# Patient Record
Sex: Female | Born: 1950 | Race: White | Hispanic: No | Marital: Married | Smoking: Former smoker
Health system: Southern US, Community
[De-identification: ages and names within clinical notes are randomized; demographics above are authoritative.]

## PROBLEM LIST (undated history)

## (undated) DIAGNOSIS — M549 Dorsalgia, unspecified: Secondary | ICD-10-CM

## (undated) DIAGNOSIS — J189 Pneumonia, unspecified organism: Secondary | ICD-10-CM

## (undated) DIAGNOSIS — G20 Parkinson's disease: Secondary | ICD-10-CM

## (undated) DIAGNOSIS — F32A Depression, unspecified: Secondary | ICD-10-CM

## (undated) DIAGNOSIS — J449 Chronic obstructive pulmonary disease, unspecified: Secondary | ICD-10-CM

## (undated) DIAGNOSIS — F419 Anxiety disorder, unspecified: Secondary | ICD-10-CM

## (undated) DIAGNOSIS — I10 Essential (primary) hypertension: Secondary | ICD-10-CM

## (undated) DIAGNOSIS — J42 Unspecified chronic bronchitis: Secondary | ICD-10-CM

## (undated) DIAGNOSIS — G709 Myoneural disorder, unspecified: Secondary | ICD-10-CM

## (undated) DIAGNOSIS — R911 Solitary pulmonary nodule: Secondary | ICD-10-CM

## (undated) DIAGNOSIS — E039 Hypothyroidism, unspecified: Secondary | ICD-10-CM

## (undated) DIAGNOSIS — G20A1 Parkinson's disease without dyskinesia, without mention of fluctuations: Secondary | ICD-10-CM

## (undated) DIAGNOSIS — E079 Disorder of thyroid, unspecified: Secondary | ICD-10-CM

## (undated) DIAGNOSIS — F329 Major depressive disorder, single episode, unspecified: Secondary | ICD-10-CM

## (undated) HISTORY — DX: Pneumonia, unspecified organism: J18.9

## (undated) HISTORY — DX: Anxiety disorder, unspecified: F41.9

## (undated) HISTORY — PX: CATARACT EXTRACTION, BILATERAL: SHX1313

## (undated) HISTORY — DX: Myoneural disorder, unspecified: G70.9

## (undated) HISTORY — DX: Solitary pulmonary nodule: R91.1

## (undated) HISTORY — DX: Parkinson's disease: G20

## (undated) HISTORY — DX: Parkinson's disease without dyskinesia, without mention of fluctuations: G20.A1

## (undated) HISTORY — DX: Unspecified chronic bronchitis: J42

## (undated) HISTORY — DX: Hypothyroidism, unspecified: E03.9

## (undated) HISTORY — DX: Chronic obstructive pulmonary disease, unspecified: J44.9

## (undated) HISTORY — DX: Disorder of thyroid, unspecified: E07.9

## (undated) HISTORY — DX: Dorsalgia, unspecified: M54.9

## (undated) HISTORY — DX: Major depressive disorder, single episode, unspecified: F32.9

## (undated) HISTORY — DX: Depression, unspecified: F32.A

## (undated) HISTORY — DX: Essential (primary) hypertension: I10

## (undated) HISTORY — PX: OTHER SURGICAL HISTORY: SHX169

---

## 1997-12-26 ENCOUNTER — Other Ambulatory Visit: Admission: RE | Admit: 1997-12-26 | Discharge: 1997-12-26 | Payer: Self-pay | Admitting: Obstetrics and Gynecology

## 1999-02-22 ENCOUNTER — Other Ambulatory Visit: Admission: RE | Admit: 1999-02-22 | Discharge: 1999-02-22 | Payer: Self-pay | Admitting: Obstetrics and Gynecology

## 1999-06-13 ENCOUNTER — Other Ambulatory Visit: Admission: RE | Admit: 1999-06-13 | Discharge: 1999-06-13 | Payer: Self-pay | Admitting: Obstetrics and Gynecology

## 2000-02-24 ENCOUNTER — Other Ambulatory Visit: Admission: RE | Admit: 2000-02-24 | Discharge: 2000-02-24 | Payer: Self-pay | Admitting: Obstetrics and Gynecology

## 2001-03-16 ENCOUNTER — Other Ambulatory Visit: Admission: RE | Admit: 2001-03-16 | Discharge: 2001-03-16 | Payer: Self-pay | Admitting: Obstetrics and Gynecology

## 2001-12-03 ENCOUNTER — Ambulatory Visit (HOSPITAL_COMMUNITY): Admission: RE | Admit: 2001-12-03 | Discharge: 2001-12-03 | Payer: Self-pay | Admitting: Gastroenterology

## 2002-03-22 ENCOUNTER — Other Ambulatory Visit: Admission: RE | Admit: 2002-03-22 | Discharge: 2002-03-22 | Payer: Self-pay | Admitting: Obstetrics and Gynecology

## 2003-03-28 ENCOUNTER — Other Ambulatory Visit: Admission: RE | Admit: 2003-03-28 | Discharge: 2003-03-28 | Payer: Self-pay | Admitting: Obstetrics and Gynecology

## 2004-04-02 ENCOUNTER — Other Ambulatory Visit: Admission: RE | Admit: 2004-04-02 | Discharge: 2004-04-02 | Payer: Self-pay | Admitting: Obstetrics and Gynecology

## 2004-10-09 ENCOUNTER — Ambulatory Visit (HOSPITAL_COMMUNITY): Admission: RE | Admit: 2004-10-09 | Discharge: 2004-10-10 | Payer: Self-pay | Admitting: Orthopaedic Surgery

## 2005-04-02 ENCOUNTER — Other Ambulatory Visit: Admission: RE | Admit: 2005-04-02 | Discharge: 2005-04-02 | Payer: Self-pay | Admitting: Obstetrics and Gynecology

## 2006-04-08 ENCOUNTER — Other Ambulatory Visit: Admission: RE | Admit: 2006-04-08 | Discharge: 2006-04-08 | Payer: Self-pay | Admitting: *Deleted

## 2007-04-14 ENCOUNTER — Other Ambulatory Visit: Admission: RE | Admit: 2007-04-14 | Discharge: 2007-04-14 | Payer: Self-pay | Admitting: Family Medicine

## 2009-09-04 ENCOUNTER — Inpatient Hospital Stay: Payer: Self-pay | Admitting: Internal Medicine

## 2009-11-01 ENCOUNTER — Encounter: Admission: RE | Admit: 2009-11-01 | Discharge: 2009-11-01 | Payer: Self-pay | Admitting: Family Medicine

## 2009-11-07 ENCOUNTER — Other Ambulatory Visit: Admission: RE | Admit: 2009-11-07 | Discharge: 2009-11-07 | Payer: Self-pay | Admitting: Family Medicine

## 2009-11-19 DIAGNOSIS — F341 Dysthymic disorder: Secondary | ICD-10-CM

## 2009-11-20 ENCOUNTER — Ambulatory Visit: Payer: Self-pay | Admitting: Internal Medicine

## 2009-11-20 DIAGNOSIS — J45909 Unspecified asthma, uncomplicated: Secondary | ICD-10-CM | POA: Insufficient documentation

## 2009-11-20 DIAGNOSIS — J479 Bronchiectasis, uncomplicated: Secondary | ICD-10-CM

## 2010-01-09 ENCOUNTER — Ambulatory Visit: Payer: Self-pay | Admitting: Internal Medicine

## 2010-04-16 ENCOUNTER — Telehealth: Payer: Self-pay | Admitting: Internal Medicine

## 2010-04-23 ENCOUNTER — Encounter: Payer: Self-pay | Admitting: Internal Medicine

## 2010-04-24 ENCOUNTER — Ambulatory Visit: Payer: Self-pay | Admitting: Internal Medicine

## 2010-04-24 DIAGNOSIS — J984 Other disorders of lung: Secondary | ICD-10-CM | POA: Insufficient documentation

## 2010-06-11 NOTE — Miscellaneous (Signed)
Summary: Orders Update pft charges  Clinical Lists Changes  Orders: Added new Service order of Carbon Monoxide diffusing w/capacity (94720) - Signed Added new Service order of Lung Volumes (94240) - Signed Added new Service order of Spirometry (Pre & Post) (94060) - Signed 

## 2010-06-11 NOTE — Progress Notes (Signed)
Summary: needs rov with cxr  ---- Converted from flag ---- ---- 01/09/2010 8:23 PM, Nyoka Cowden MD wrote: f/u cxr due ------------------------------  [id number]  Vernie Murders  April 16, 2010 AM   pt aware appt and cxr scheduled fo r12-14-11. Carron Curie CMA  April 16, 2010 4:08 PM

## 2010-06-11 NOTE — Assessment & Plan Note (Signed)
Summary: Pulmonary/ ext ov with hfa 75% p coaching   Copy to:  Dr. [name] Primary Provider/Referring Provider:  Dr. [name]  CC:  followup on pfts and c/o a crackly feeling in throat and throat with heat have to use the ventolin at least 2x a week.  History of Present Illness: 60  yowf quit smoking around 1980's with tendency to bad coughing going back to when she was in her 30's and after stopped smoking seemed some better then worse again in mid 80's took shots for  " dust and mold allergy"  for several years didn't really help so stopped sometime  before 1988.  November 20, 2009  1st pulmonary office eval  cc recurrent cough since her 30s  that required abx and intermittent prednisone but did not require maint rx or any kind of inhalers and felt well between episodes up to 2 x per year.  Then April 2011 after bagging leaves bad cough green mucus,  sweaty, fever > er to [facility] admit x 1 day with dx pna  and rx with abx and on day of ov feels fine.  No cough, no sob, not needing ventolin now ? if needs advair, reduced it on her own to once per day and no noct symptoms. rec: try off advair, rx gerd diet and as needed albuterol, work on optimal hfa technique  January 09, 2010 followup on pfts, c/o a crackly feeling in throat and throat with heat have to use the ventolin at least 2x a week.  no noct or early am symptoms of cough or sob.  Pt denies any significant sore throat, dysphagia, itching, sneezing,  nasal congestion or excess secretions,  fever, chills, sweats, unintended wt loss, pleuritic or exertional cp, hempoptysis, change in activity tolerance  orthopnea pnd or leg swelling Pt also denies any obvious fluctuation in symptoms with weather or environmental change or other alleviating or aggravating factors x as above.    Preventive Screening-Counseling & Management  Alcohol-Tobacco     Smoking Status: quit > 6 months     Year Quit: 1970  Current Medications  (verified): 1)  Ventolin Hfa 108 (90 Base) Mcg/act Aers (Albuterol Sulfate) .... 2 Puffs Every 4-6 Hrs As Needed 2)  Vitamin E-400 400 Unit Caps (Vitamin E) .Marland Kitchen.. 1 Once Daily 3)  Calcium Lactate 750 Mg Tabs (Calcium Lactate) .... 2 Once Daily 4)  Vitamin B-12 2500 Mcg Subl (Cyanocobalamin) .Marland Kitchen.. 1 Once Daily 5)  Sertraline Hcl 100 Mg Tabs (Sertraline Hcl) .Marland Kitchen.. 1 Once Daily 6)  Glucosamine Hci Msn 1500-150 Mg .Marland Kitchen.. 1 By Mouth Once Daily 7)  Melatonin 3 Mg .Marland Kitchen.. 1 By Mouth Once Daily 8)  Otc Vitamin D 2000 Iu .Marland Kitchen.. 1 By Mouth Once Daily  Allergies: 1)  ! Pcn 2)  ! Cipro  Past History:  Past Medical History: Asthma    - HFA 50% at best November 20, 2009 >  75% January 09, 2010  Bronchiectasis with nodules    - See CT chest 11/01/09    - PFT's January 09, 2010 FEV1 1.85 (78%) ratio 62  no better p B2  DLCO 89%    - Pneumovax January 09, 2010 (second shot age 89)   Social History: Smoking Status:  quit > 6 months  Vital Signs:  Patient profile:   60 year old female Height:      64.5 inches Weight:      172 pounds BMI:     29.17 O2 Sat:  95 % on Room air Temp:     98.2 degrees F oral Pulse rate:   88 / minute BP sitting:   140 / 80  (right arm) Cuff size:   regular  Vitals Entered By: Vernie Murders (January 09, 2010 4:24 PM)  O2 Flow:  Room air CC: followup on pfts, c/o a crackly feeling in throat and throat with heat have to use the ventolin at least 2x a week Is Patient Diabetic? No Comments pt want flu vaccine today pharmacy verified .Vernie Murders  January 09, 2010 4:26 PM    Physical Exam  Additional Exam:  amb slt  wf nad no longer hoarse wt 170 November 21, 2009 > 172 January 09, 2010  HEENT: nl dentition, turbinates, and orophanx. Nl external ear canals without cough reflex NECK :  without JVD/Nodes/TM/ nl carotid upstrokes bilaterally LUNGS: no acc muscle use, clear to A and P bilaterally without cough on insp or exp maneuvers CV:  RRR  no s3 or murmur or increase in P2,  no edema  ABD:  soft and nontender with nl excursion in the supine position. 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 Contingencies discussed today including the rule of two's.     I spent extra time with the patient today explaining optimal mdi  technique.  This improved from  50-75%, ok to start dulera 100 if symptoms worsen or needing saba > 2 x weekly  Medications Added to Medication List This Visit: 1)  Glucosamine Hci Msn 1500-150 Mg  .Marland KitchenMarland Kitchen. 1 by mouth once daily 2)  Melatonin 3 Mg  .Marland KitchenMarland Kitchen. 1 by mouth once daily 3)  Otc Vitamin D 2000 Iu  .Marland Kitchen.. 1 by mouth once daily  Other Orders: Flu Vaccine 65yrs + (04540) Administration Flu vaccine - MCR (J8119) Pneumococcal Vaccine (14782) Admin 1st Vaccine (95621) Admin of Any Addtl Vaccine (30865) Est. Patient Level IV (78469)  Patient Instructions: 1)  If needing ventolin more than twice a week start dulera 100 2 puffs first thing  in am and 2 puffs again in pm about 12 hours later  2)  Return as needed  Flu Vaccine Consent Questions     Do you have a history of severe allergic reactions to this vaccine? no    Any prior history of allergic reactions to egg and/or gelatin? no    Do you have a sensitivity to the preservative Thimersol? no    Do you have a past history of Guillan-Barre Syndrome? no    Do you currently have an acute febrile illness? no    Have you ever had  a severe reaction to latex? no    Vaccine information given and explained to patient? yes    Are you currently pregnant? no    Lot Number:[id number]   Exp Date:11/09/2010   Manufacturer: [employer]    Site Given  Left Deltoid Kittie Plater  January 09, 2010 4:54 PM  Immunizations Administered:  Pneumonia Vaccine:    Vaccine Type: Pneumovax    Site: right deltoid    Mfr: Merck    Dose: 0.5 ml    Given by: Vernie Murders    Exp. Date: 05/29/2011    Lot #: [id number]    VIS given: 12/08/95 version given January 09, 2010.  .Neysa Bonito

## 2010-06-11 NOTE — Assessment & Plan Note (Signed)
Summary: Pulmonary/ new pt eval for bronchiectasis   Visit Type:  Initial Consult Copy to:  Dr. [name] Primary Provider/Referring Provider:  Dr. [name]  CC:  Abnormal CT Chest.  History of Present Illness: 60 yowf quit smoking around 1980's with tendency to bad coughing going back to when she was in her 30's and after stopped smoking seemed some better then worse again in mid 80's took shots for  " dust and mold allergy"  for several years didn't really help so stopped sometime  before 1988.  November 20, 2009  1st pulmonary office eval  cc recurrent cough since her 30s  that required abx and intermittent prednisone but did not require maint rx or any kind of inhalers and felt well between episodes up to 2 x per year.  Then April 2011 after bagging leaves bad cough green mucus,  sweaty, fever > er to [facility] admit x 1 day with dx pna  and rx with abx and on day of ov feels fine.  No cough, no sob, not needing ventolin now ? if needs advair, reduced it on her own to once per day and no noct symptoms.  Pt denies any significant sore throat, dysphagia, itching, sneezing,  nasal congestion or excess secretions,  fever, chills, sweats, unintended wt loss, pleuritic or exertional cp, hempoptysis, change in activity tolerance  orthopnea pnd or leg swelling Pt also denies any obvious fluctuation in symptoms with weather or environmental change or other alleviating or aggravating factors.       Current Medications (verified): 1)  Ventolin Hfa 108 (90 Base) Mcg/act Aers (Albuterol Sulfate) .... 2 Puffs Every 4-6 Hrs As Needed 2)  Vitamin E-400 400 Unit Caps (Vitamin E) .Marland Kitchen.. 1 Once Daily 3)  Calcium Lactate 750 Mg Tabs (Calcium Lactate) .... 2 Once Daily 4)  Fish Oil 1200 Mg Caps (Omega-3 Fatty Acids) .Marland Kitchen.. 1 Once Daily 5)  Vitamin B-12 2500 Mcg Subl (Cyanocobalamin) .Marland Kitchen.. 1 Once Daily 6)  Advair Diskus 100-50 Mcg/dose Aepb (Fluticasone-Salmeterol) .Marland Kitchen.. 1 Puff Two Times A Day 7)   Sertraline Hcl 100 Mg Tabs (Sertraline Hcl) .Marland Kitchen.. 1 Once Daily  Allergies: 1)  ! Pcn 2)  ! Cipro  Past History:  Past Medical History: Asthma    - HFA 50% at best November 20, 2009  Bronchiectasis with nodules    - See CT chest 11/01/09  Past Surgical History: Back surgery x 2- 1996 and 2005 Tubal ligation- 1992  Family History: Heart dz- Father Negative for respiratory diseases or atopy   Social History: Married with children Retired Runner, broadcasting/film/video Former smoker.  Quit in 1980.  Smoked approx 10 yrs up to 1/4 ppd. ETOH occ wine  Review of Systems  The patient denies shortness of breath with activity, shortness of breath at rest, productive cough, non-productive cough, coughing up blood, chest pain, irregular heartbeats, acid heartburn, indigestion, loss of appetite, weight change, abdominal pain, difficulty swallowing, sore throat, tooth/dental problems, headaches, nasal congestion/difficulty breathing through nose, sneezing, itching, ear ache, anxiety, depression, hand/feet swelling, joint stiffness or pain, rash, change in color of mucus, and fever.    Vital Signs:  Patient profile:   60 year old female Height:      64.5 inches Weight:      170.50 pounds BMI:     28.92 O2 Sat:      96 % on Room air Temp:     98.1 degrees F oral Pulse rate:   77 / minute BP sitting:  150 / 90  (left arm)  Vitals Entered By: Vernie Murders (November 20, 2009 8:45 AM)  O2 Flow:  Room air  Physical Exam  Additional Exam:  amb slt hoarse wf nad wt 170 November 21, 2009  HEENT: nl dentition, turbinates, and orophanx. Nl external ear canals without cough reflex NECK :  without JVD/Nodes/TM/ nl carotid upstrokes bilaterally LUNGS: no acc muscle use, clear to A and P bilaterally without cough on insp or exp maneuvers CV:  RRR  no s3 or murmur or increase in P2, no edema  ABD:  soft and nontender with nl excursion in the supine position. No bruits or organomegaly, bowel sounds nl MS:  warm without  deformities, calf tenderness, cyanosis or clubbing SKIN: warm and dry without lesions   NEURO:  alert, approp, no deficits     CT of Chest  Procedure date:  11/01/2009  Findings:      bronchiectasis clustered nodularity and mild lingular consolidation  Impression & Recommendations:  Problem # 1:  ASTHMA (ICD-493.90) All goals of asthma met including optimal function and elimination of symptoms with minimum need for rescue therapy. Contingencies discussed today including the rule of two's.   Not clear she needs advair, will ask her to stop it and return for pfts  I spent extra time with the patient today explaining optimal mdi  technique.  This improved from  25-50%  Problem # 2:  BRONCHIECTASIS W/O ACUTE EXACERBATION (ICD-494.0) Longstanding perhaps with low grade MAI by CT but certainly does not need aggressive w/u or rx at this point  Discussed natural hx and meaning of bronchial scarring. Discussed in detail all the  indications, usual  risks and alternatives  relative to the benefits with patient who agrees to proceed with conservative F/U here serially to put more points on the curve.  General GERD precaution given including avoidance of oils in diet  Other Orders: Consultation Level V (16109) Consultation Level V (60454)  Patient Instructions: 1)  Scarring of bronchial tubes leads to stagnant mucus 2)  Stop advair 3)  Ventolin as needed is fine as long as don't need to use it more than twice a week and if so start back on advair 4)  Please schedule a follow-up appointment in 6 weeks, sooner if needed with PFT's  5)  for cough mucinex dm as needed  6)  GERD (REFLUX)  is a common cause of respiratory symptoms. It commonly presents without heartburn and can be treated with medication, but also with lifestyle changes including avoidance of late meals, excessive alcohol, smoking cessation, and avoid fatty foods, chocolate, peppermint, colas, red wine, and acidic juices such as  orange juice. NO MINT OR MENTHOL PRODUCTS SO NO COUGH DROPS  7)  USE SUGARLESS CANDY INSTEAD (jolley ranchers)  8)  NO OIL BASED VITAMINS (includes fish oil)

## 2010-06-13 NOTE — Assessment & Plan Note (Signed)
Summary: Pulmonary/ ext f/u ov for bronchiectasis/asthma hfa 75%   Copy to:  Dr. [name] Primary Provider/Referring Provider:  Dr. [name]  CC:  Cough-resolved.  History of Present Illness: 64  yowf quit smoking around 1980's with tendency to bad coughing going back to when she was in her 30's and after stopped smoking seemed some better then worse again in mid 80's took shots for  " dust and mold allergy"  for several years didn't really help so stopped sometime  before 1988.  November 20, 2009  1st pulmonary office eval  cc recurrent cough since her 30s  that required abx and intermittent prednisone but did not require maint rx or any kind of inhalers and felt well between episodes up to 2 x per year.  Then April 2011 after bagging leaves bad cough green mucus,  sweaty, fever > er to [facility] admit x 1 day with dx pna  and rx with abx and on day of ov feels fine.  rec: try off advair, rx gerd diet and as needed albuterol, work on optimal hfa technique  January 09, 2010 followup on pfts, c/o a crackly feeling in throat and throat with heat have to use the ventolin at least 2x a week.  no noct or early am symptoms of cough or sob. If needing ventolin more than twice a week start dulera 100 2 puffs first thing  in am and 2 puffs again in pm about 12 hours later  April 24, 2010 ov cc cough/ wheeze/ sob resolved on dulera 100 so able to taper to one twice daily and no flare.  Pt denies any significant sore throat, dysphagia, itching, sneezing,  nasal congestion or excess secretions,  fever, chills, sweats, unintended wt loss, pleuritic or exertional cp, hempoptysis, change in activity tolerance  orthopnea pnd or leg swelling Pt also denies any obvious fluctuation in symptoms with weather or environmental change or other alleviating or aggravating factors.       Current Medications (verified): 1)  Ventolin Hfa 108 (90 Base) Mcg/act Aers (Albuterol Sulfate) .... 2 Puffs Every 4-6  Hrs As Needed 2)  Vitamin E-400 400 Unit Caps (Vitamin E) .Marland Kitchen.. 1 Once Daily 3)  Calcium Lactate 750 Mg Tabs (Calcium Lactate) .... 2 Once Daily 4)  Vitamin B-12 2500 Mcg Subl (Cyanocobalamin) .Marland Kitchen.. 1 Once Daily 5)  Sertraline Hcl 100 Mg Tabs (Sertraline Hcl) .Marland Kitchen.. 1 Once Daily 6)  Glucosamine Hci Msn 1500-150 Mg .Marland Kitchen.. 1 By Mouth Once Daily 7)  Vitamin D 2000 Unit Tabs (Cholecalciferol) .Marland Kitchen.. 1 Once Daily 8)  Excedrin Extra Strength 250-250-65 Mg Tabs (Aspirin-Acetaminophen-Caffeine) .... Per Bottle Directions As Needed  Allergies (verified): 1)  ! Pcn 2)  ! Cipro  Past History:  Past Medical History: Asthma    - HFA 50% at best November 20, 2009 >  75% January 09, 2010 > 75% April 24, 2010  Bronchiectasis with nodules    - See CT chest 11/01/09    - PFT's January 09, 2010 FEV1 1.85 (78%) ratio 62  no better p B2  DLCO 89%    - Pneumovax January 09, 2010 (second shot age 9)   Vital Signs:  Patient profile:   60 year old female Weight:      179.50 pounds O2 Sat:      97 % on Room air Temp:     97.9 degrees F oral Pulse rate:   87 / minute BP sitting:   118 / 90  (left arm)  Vitals Entered By: Vernie Murders (April 24, 2010 9:07 AM)  O2 Flow:  Room air  Physical Exam  Additional Exam:  amb healthy appearin g  wf nad   wt 170 November 21, 2009 > 172 January 09, 2010 > 179  April 24, 2010  HEENT: nl dentition, turbinates, and orophanx. Nl external ear canals without cough reflex NECK :  without JVD/Nodes/TM/ nl carotid upstrokes bilaterally LUNGS: no acc muscle use, clear to A and P bilaterally without cough on insp or exp maneuvers CV:  RRR  no s3 or murmur or increase in P2, no edema  ABD:  soft and nontender with nl excursion in the supine position. No bruits or organomegaly, bowel sounds nl MS:  warm without deformities, calf tenderness, cyanosis or clubbing      CXR  Procedure date:  04/24/2010  Findings:      Stable scarring and/or bronchiectatic change in the lingula.   No active process.  Impression & Recommendations:  Problem # 1:  BRONCHIECTASIS W/O ACUTE EXACERBATION (ICD-494.0)  Assoc with minimal yet persistent airflow obstruction with minimal asthmatic component  For now, rx pneumovax and yearly flu plus maintain on dulera   I spent extra time with the patient today explaining optimal mdi  technique.  This improved from 50-75%  p coaching     Problem # 2:  PULMONARY NODULE (ICD-518.89) Although there are clearly abnormalities on CT scan, they should probably be considered "microscopic" since not obvious on plain cxr .   Muliple pulmonary nodules < 8 mm are below the radar screen for PET,  minimally invasive bx or f/u on cxr for that matter, and old xrays won't do any good here.  Most likely they are benign and related to bronchiectasis, can't r/o MAI but even if it was MAI no indication for rx so rec cxr in one year, sooner if new resp symptoms  Medications Added to Medication List This Visit: 1)  Vitamin D 2000 Unit Tabs (Cholecalciferol) .Marland Kitchen.. 1 once daily 2)  Excedrin Extra Strength [phone] Mg Tabs (Aspirin-acetaminophen-caffeine) .... Per bottle directions as needed 3)  Dulera 100-5 Mcg/act Aero (Mometasone furo-formoterol fum) .... 2 puffs first thing  in am and 2 puffs again in pm about 12 hours later  Complete Medication List: 1)  Ventolin Hfa 108 (90 Base) Mcg/act Aers (Albuterol sulfate) .... 2 puffs every 4-6 hrs as needed 2)  Vitamin E-400 400 Unit Caps (Vitamin e) .Marland Kitchen.. 1 once daily 3)  Calcium Lactate 750 Mg Tabs (Calcium lactate) .... 2 once daily 4)  Vitamin B-12 2500 Mcg Subl (Cyanocobalamin) .Marland Kitchen.. 1 once daily 5)  Sertraline Hcl 100 Mg Tabs (Sertraline hcl) .Marland Kitchen.. 1 once daily 6)  Glucosamine Hci Msn 1500-150 Mg  .Marland KitchenMarland Kitchen. 1 by mouth once daily 7)  Vitamin D 2000 Unit Tabs (Cholecalciferol) .Marland Kitchen.. 1 once daily 8)  Excedrin Extra Strength 250-250-65 Mg Tabs (Aspirin-acetaminophen-caffeine) .... Per bottle directions as needed 9)   Dulera 100-5 Mcg/act Aero (Mometasone furo-formoterol fum) .... 2 puffs first thing  in am and 2 puffs again in pm about 12 hours later  Other Orders: Est. Patient Level IV (93235)  Patient Instructions: 1)   Work on perfecting  inhaler technique:  relax and blow all the way out then take a nice smooth deep breath back in, triggering the inhaler at same time you start breathing in and hold for a few seconds. 2)  Return in one year for refills on dulera sooner if cough/ short of breathing or  whistling sound recur  Prescriptions: DULERA 100-5 MCG/ACT AERO (MOMETASONE FURO-FORMOTEROL FUM) 2 puffs first thing  in am and 2 puffs again in pm about 12 hours later  #1 x 11   Entered and Authorized by:   Nyoka Cowden MD   Signed by:   Nyoka Cowden MD on 04/24/2010   Method used:   Print then Give to Patient   RxID:   [id number]

## 2010-06-13 NOTE — Miscellaneous (Signed)
Summary: Orders Update  Clinical Lists Changes  Orders: Added new Test order of T-2 View CXR (71020TC) - Signed 

## 2010-09-27 NOTE — Procedures (Signed)
[facility]  Patient:    Gail Mercado, Gail Mercado Visit Number: [id number] MRN: [id number]          Service Type: END Location: ENDO Attending Physician:  Charna Elizabeth Dictated by:   Anselmo Rod, M.D. Proc. Date: 12/03/01 Admit Date:  12/03/2001   CC:         Cynthia P. Matthews-Romine, M.D.   Procedure Report  DATE OF BIRTH:  November 03, 1950  REFERRING PHYSICIAN:  Edwena Felty. Matthews-Romine, M.D.  PROCEDURE PERFORMED:  Screening colonoscopy.  ENDOSCOPIST:  Anselmo Rod, M.D.  INSTRUMENT USED:  Olympus pediatric adjustable colonoscope.  INDICATIONS FOR PROCEDURE:  The patient is a 59 year old white female with a history of guaiac positive stools undergoing screening colonoscopy.  Rule out colonic polyps, masses, hemorrhoids, etc.  PREPROCEDURE PREPARATION:  Informed consent was procured from the patient. The patient was fasted for eight hours prior to the procedure and prepped with a bottle of magnesium citrate and a gallon of NuLytely the night prior to the procedure.  PREPROCEDURE PHYSICAL:  The patient had stable vital signs.  Neck supple. Chest clear to auscultation.  S1, S2 regular.  Abdomen soft with normal bowel sounds.  DESCRIPTION OF PROCEDURE:  The patient was placed in the left lateral decubitus position and sedated with 100 mg of Demerol and 10 mg of Versed intravenously.  Once the patient was adequately sedated and maintained on low-flow oxygen and continuous cardiac monitoring, the Olympus video colonoscope was advanced from the rectum to the cecum and terminal ileum without difficulty.  The entire colonic mucosa appeared healthy with a normal vascular pattern, no masses, polyps, erosions, ulcerations or diverticula were seen.  The patient tolerated the procedure well without complications.  The appendiceal orifice and ileocecal valve were clearly visualized and photographed.  The terminal ileum appeared healthy as well.   There was no evidence of hemorrhoids on retroflexion in the rectum but a small internal hemorrhoids was seen on withdrawal of the scope.  IMPRESSION:  Normal colonoscopy to terminal ileum except for small internal hemorrhoids.  RECOMMENDATIONS: 1. A high fiber diet has been recommended. 2. Repeat guaiac testing to be done on an outpatient basis and further recommendations made as needed.Dictated by:   Anselmo Rod, M.D.  Attending Physician:  Charna Elizabeth DD:  12/03/01 TD:  12/06/01 Job: [id number]

## 2010-09-27 NOTE — Op Note (Signed)
NAMEELAN, Gail Mercado                 ACCOUNT NO.:  [id number]   MEDICAL RECORD NO.:  [id number]          PATIENT TYPE:  OIB   LOCATION:  5002                         FACILITY:  [facility]   PHYSICIAN:  Sharolyn Douglas, M.D.        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   PROCEDURE:  Left L4-5 extraforaminal diskectomy via intertransverse  approach.   SURGEON:  Sharolyn Douglas M.D.   ASSISTANT:  Verlin Fester, P.A.   ANESTHESIA:  General endotracheal.   COMPLICATIONS:  None.   ESTIMATED BLOOD LOSS:  Minimal.   INDICATIONS:  The patient is a pleasant 60 year old female with a large  intra an extraforaminal disk herniation at L4-5 on the left. She has  profound quadriceps weakness. She has elected to undergo extraforaminal  diskectomy in hopes of improving her weakness. Risks and benefits were  reviewed including non benefits, chronic pain, recurrent disk herniation and  need for additional surgery including fusion.   PROCEDURE:  The patient was identified in the holding area, taken to the  operating room, underwent general endotracheal anesthesia without  difficulty, given prophylactic IV antibiotics. Carefully positioned prone on  the Wilson frame. All bony prominences padded. Face and eyes protected at  all times. Back prepped, draped usual sterile fashion. Fluoroscopy was  brought into the field and the L4-5 level was identified. A 3 cm incision  was made 2 cm off the midline on the left side over the L4-5 level. The deep  fascia was incised. Dilators from the Max S retractor system were used to  dock onto the transverse processes of L4 and L5. We then placed the 70-mm  blades onto the retractor and placed it over the final dilator. The  retractor was attached to the operating room table using the attachment arm.  The  retractor was then gently spread dilating the paraspinal muscles.  Microscope was draped and brought into the field. The intertransverse  membrane was exposed. The pars interarticularis was identified. The  intertransverse membrane was then carefully elevated. The L4 nerve root was  identified, swollen and displaced dorsally. The nerve root was mobilized  laterally and the disk was identified. The disk space was easily entered  using a Cytogeneticist. We then utilized a pituitary to enter the disk  space and remove several large fragments of degenerative disk material. The  nerve root became more mobile. We then explored the extraforaminal space and  found a large disk fragment which had migrated proximally under the nerve  root. The wound was irrigated. Bleeding  was controlled. Fentanyl was left over the nerve root. The deep fascia  closed with an 0 Vicryl suture. Subcutaneous layer closed with 2-0 Vicryl  followed by Dermabond to approximate the skin edges. The patient was turned  supine, extubated without difficulty and transferred to recovery room in  stable condition.      MC/MEDQ  D:  10/09/2004  T:  10/10/2004  Job:  [id number]

## 2011-04-29 ENCOUNTER — Encounter: Payer: Self-pay | Admitting: Pulmonary Disease

## 2011-04-30 ENCOUNTER — Ambulatory Visit (INDEPENDENT_AMBULATORY_CARE_PROVIDER_SITE_OTHER): Payer: BC Managed Care – PPO | Admitting: Internal Medicine

## 2011-04-30 ENCOUNTER — Encounter: Payer: Self-pay | Admitting: Internal Medicine

## 2011-04-30 VITALS — BP 150/88 | HR 86 | Temp 97.7°F | Ht 65.0 in | Wt 168.4 lb

## 2011-04-30 DIAGNOSIS — J479 Bronchiectasis, uncomplicated: Secondary | ICD-10-CM

## 2011-04-30 MED ORDER — MOMETASONE FURO-FORMOTEROL FUM 100-5 MCG/ACT IN AERO: 1.0000 | INHALATION_SPRAY | Freq: Two times a day (BID) | RESPIRATORY_TRACT | Status: DC

## 2011-04-30 NOTE — Progress Notes (Signed)
Subjective:     Patient ID: Gail Mercado, female   DOB: August 24, 1950, 60 y.o.   MRN: [id number]  HPI  53  yowf  With documented bronchiectasis with minimal airflow obst quit smoking around 1980's with tendency to bad coughing going back to when she was in her 30's and after stopped smoking seemed some better then worse again in mid 80's took shots for " dust and mold allergy" for several years didn't really help so stopped sometime before 1988.   November 20, 2009 1st pulmonary office eval cc recurrent cough since her 30s that required abx and intermittent prednisone but did not require maint rx or any kind of inhalers and felt well between episodes up to 2 x per year. Then April 2011 after bagging leaves bad cough green mucus, sweaty, fever > er to [facility] admit x 1 day with dx pna and rx with abx and on day of ov feels fine.  rec: try off advair, rx gerd diet and as needed albuterol, work on optimal hfa technique   January 09, 2010 followup on pfts, c/o a crackly feeling in throat and throat with heat have to use the ventolin at least 2x a week. no noct or early am symptoms of cough or sob. If needing ventolin more than twice a week start dulera 100 2 puffs first thing in am and 2 puffs again in pm about 12 hours later   April 24, 2010 ov cc cough/ wheeze/ sob resolved on dulera 100 so able to taper to one twice daily and no flare.   04/30/2011 f/u ov/Wert cc Pt states only has occ cough, overall doing well.   tolerating dulera 100 one bid, had one flare in October 2012 controlled with 2 bid dosing and then back to one bid with minimal tremor ? A side effect. No purulent sputum. No need for saba daytime nor limiting sob nor noct resp complaints.   Pt denies any significant sore throat, dysphagia, itching, sneezing, nasal congestion or excess secretions, fever, chills, sweats, unintended wt loss, pleuritic or exertional cp, hempoptysis, change in activity tolerance orthopnea pnd or leg swelling Pt  also denies any obvious fluctuation in symptoms with weather or environmental change or other alleviating or aggravating factors.      Allergies  1) ! Pcn  2) ! Cipro   Past Medical History:  Asthma  - HFA  75% 04/30/2011  Bronchiectasis with nodules  - See CT chest 11/01/09  - PFT's January 09, 2010 FEV1 1.85 (78%) ratio 62 no better p B2 DLCO 89%  - Pneumovax January 09, 2010 (second shot age 17)     Review of Systems     Objective:   Physical Exam     amb healthy appearin g  wf nad    wt 170 November 21, 2009 > 172 January 09, 2010 > 179  April 24, 2010  > 04/30/2011 168 HEENT: nl dentition, turbinates, and orophanx. Nl external ear canals without cough reflex NECK :  without JVD/Nodes/TM/ nl carotid upstrokes bilaterally LUNGS: no acc muscle use, clear to A and P bilaterally without cough on insp or exp maneuvers CV:  RRR  no s3 or murmur or increase in P2, no edema   ABD:  soft and nontender with nl excursion in the supine position. No bruits or organomegaly, bowel sounds nl MS:  warm without deformities, calf tenderness, cyanosis or clubbing Assessment:         Plan:

## 2011-04-30 NOTE — Progress Notes (Deleted)
59  yowf quit smoking around 1980's with tendency to bad coughing going back to when she was in her 30's and after stopped smoking seemed some better then worse again in mid 80's took shots for  " dust and mold allergy"  for several years didn't really help so stopped sometime  before 1988.   November 20, 2009  1st pulmonary office eval  cc recurrent cough since her 30s  that required abx and intermittent prednisone but did not require maint rx or any kind of inhalers and felt well between episodes up to 2 x per year.  Then April 2011 after bagging leaves bad cough green mucus,  sweaty, fever > er to [facility] admit x 1 day with dx pna  and rx with abx and on day of ov feels fine.   rec: try off advair, rx gerd diet and as needed albuterol, work on optimal hfa technique   January 09, 2010 followup on pfts, c/o a crackly feeling in throat and throat with heat have to use the ventolin at least 2x a week.  no noct or early am symptoms of cough or sob. If needing ventolin more than twice a week start dulera 100 2 puffs first thing  in am and 2 puffs again in pm about 12 hours later   April 24, 2010 ov cc cough/ wheeze/ sob resolved on dulera 100 so able to taper to one twice daily and no flare.  Pt denies any significant sore throat, dysphagia, itching, sneezing,  nasal congestion or excess secretions,  fever, chills, sweats, unintended wt loss, pleuritic or exertional cp, hempoptysis, change in activity tolerance  orthopnea pnd or leg swelling Pt also denies any obvious fluctuation in symptoms with weather or environmental change or other alleviating or aggravating factors.           Current Medications (verified): 1)  Ventolin Hfa 108 (90 Base) Mcg/act Aers (Albuterol Sulfate) .... 2 Puffs Every 4-6 Hrs As Needed 2)  Vitamin E-400 400 Unit Caps (Vitamin E) .Marland Kitchen.. 1 Once Daily 3)  Calcium Lactate 750 Mg Tabs (Calcium Lactate) .... 2 Once Daily 4)  Vitamin B-12 2500 Mcg Subl (Cyanocobalamin) .Marland Kitchen.. 1 Once  Daily 5)  Sertraline Hcl 100 Mg Tabs (Sertraline Hcl) .Marland Kitchen.. 1 Once Daily 6)  Glucosamine Hci Msn 1500-150 Mg .Marland Kitchen.. 1 By Mouth Once Daily 7)  Vitamin D 2000 Unit Tabs (Cholecalciferol) .Marland Kitchen.. 1 Once Daily 8)  Excedrin Extra Strength 250-250-65 Mg Tabs (Aspirin-Acetaminophen-Caffeine) .... Per Bottle Directions As Needed   Allergies (verified): 1)  ! Pcn 2)  ! Cipro   Past History:   Past Medical History: Asthma    - HFA 50% at best November 20, 2009 >  75% January 09, 2010 > 75% April 24, 2010   Bronchiectasis with nodules    - See CT chest 11/01/09    - PFT's January 09, 2010 FEV1 1.85 (78%) ratio 62  no better p B2  DLCO 89%    - Pneumovax January 09, 2010 (second shot age 58)

## 2011-04-30 NOTE — Patient Instructions (Addendum)
Work on inhaler technique:  relax and gently blow all the way out then take a nice smooth deep breath back in, triggering the inhaler at same time you start breathing in.  Hold for up to 5 seconds if you can.  Rinse and gargle with water when done   If your mouth or throat starts to bother you,   I suggest you time the inhaler to your dental care and after using the inhaler(s) brush teeth and tongue with a baking soda containing toothpaste and when you rinse this out, gargle with it first to see if this helps your mouth and throat.     Gail Mercado works so quickly that you can adjust the dose up or down or off according to how well you are doing with a max dose of 2 puffs every 12 hours    If you are satisfied with your treatment plan let your doctor know and he/she can either refill your medications or you can return here when your prescription runs out.     If in any way you are not 100% satisfied,  please tell us.  If 100% better, tell your friends!

## 2011-04-30 NOTE — Assessment & Plan Note (Signed)
   Each maintenance medication was reviewed in detail including most importantly the difference between maintenance and as needed and under what circumstances the prns are to be used.  Please see instructions for details which were reviewed in writing and the patient given a copy.    The proper method of use, as well as anticipated side effects, of this metered-dose inhaler are discussed and demonstrated to the patient. Improved to 75% p coaching.  As long as symptoms controlled ok to adjust dulera up down or off and pulmonary f/u prn

## 2011-09-03 IMAGING — CT CT CHEST W/O CM
3 of 4 series · 17 of 30 positions shown, 19 images · non-contrast
Comparison: Chest radiograph dated 10/09/2004

CLINICAL DATA: Evaluate lesion in the left lung

CT CHEST WITHOUT CONTRAST
TECHNIQUE: Multidetector CT imaging of the chest was performed
following the standard protocol without IV contrast.

[Series 3: routine chest · axial · 0.70mm/px · z∈[-208,-3]mm · 5 of 63 slices shown, 7 images]
[im 11/63  mediastinal]
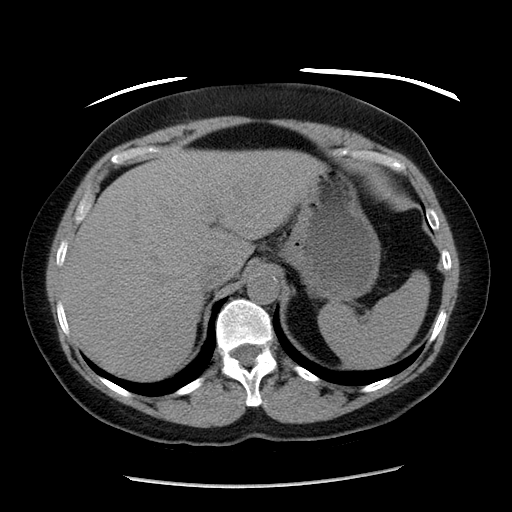
[im 11/63  lung]
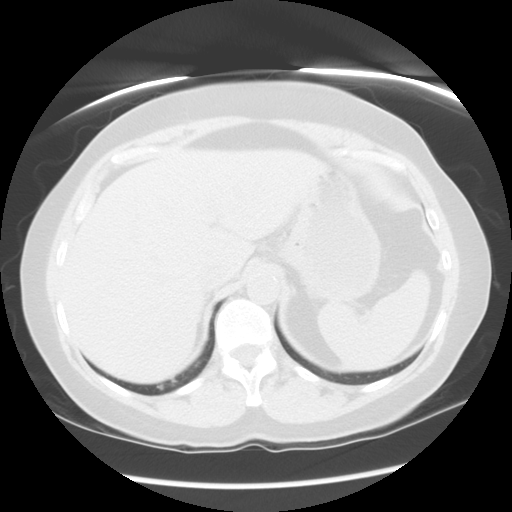
[im 21/63  lung]
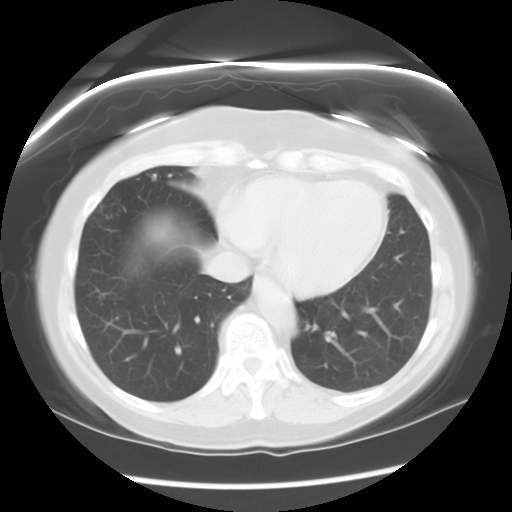
[im 32/63  lung]
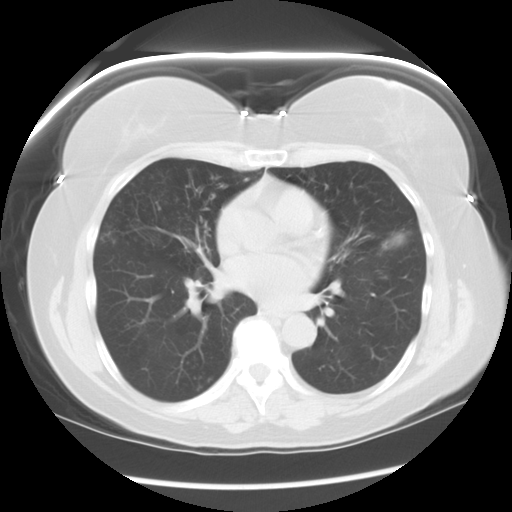
[im 42/63  lung]
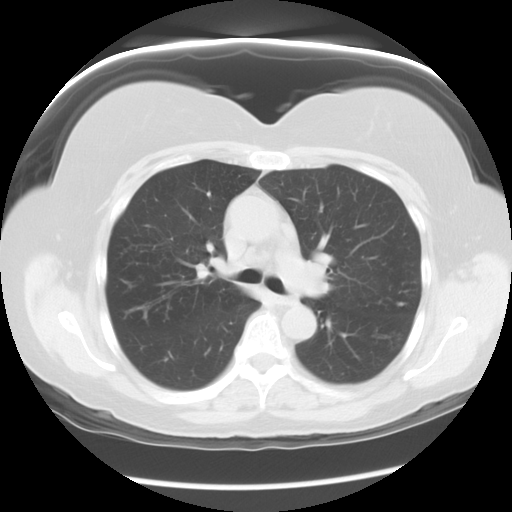
[im 52/63  mediastinal]
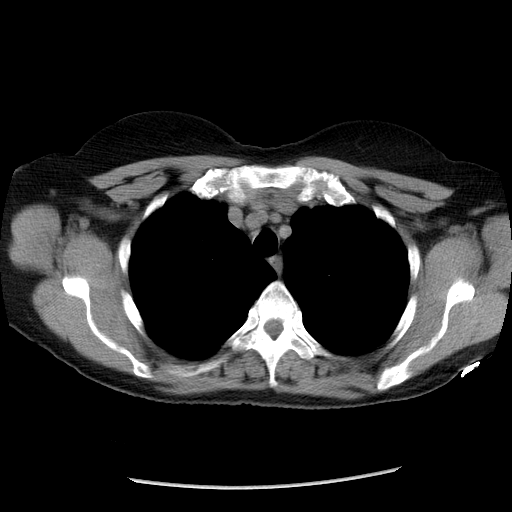
[im 52/63  lung]
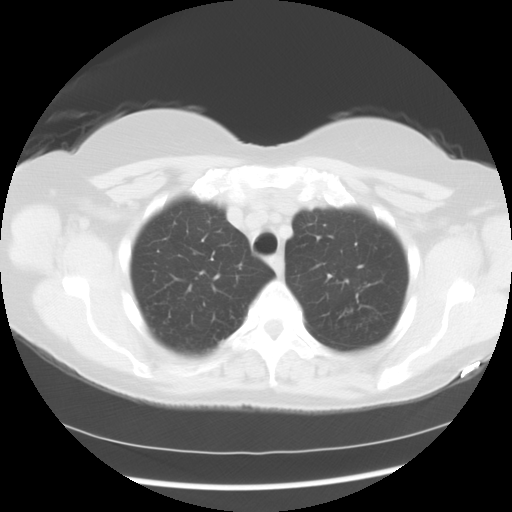

[Series 4: lung windows · axial · 0.70mm/px · z∈[-178,-8]mm · 4 of 58 slices shown]
[im 12/58  lung]
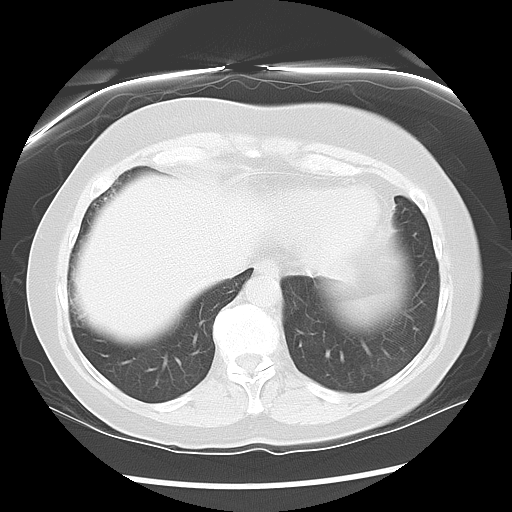
[im 23/58  lung]
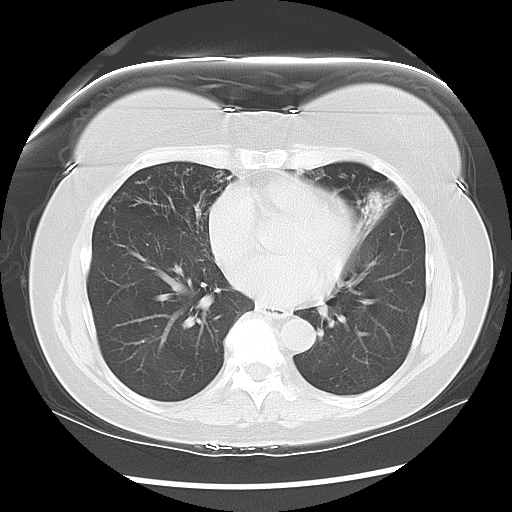
[im 35/58  lung]
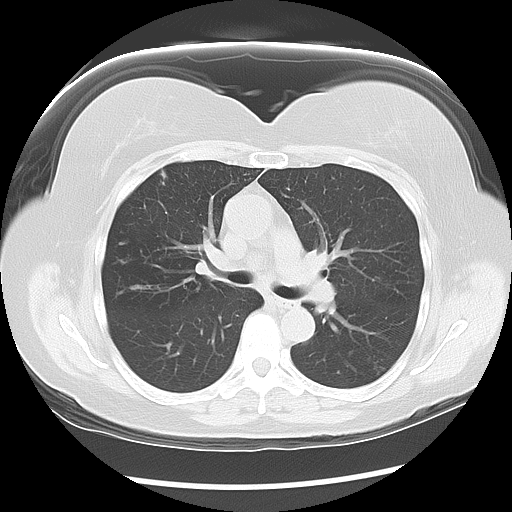
[im 46/58  lung]
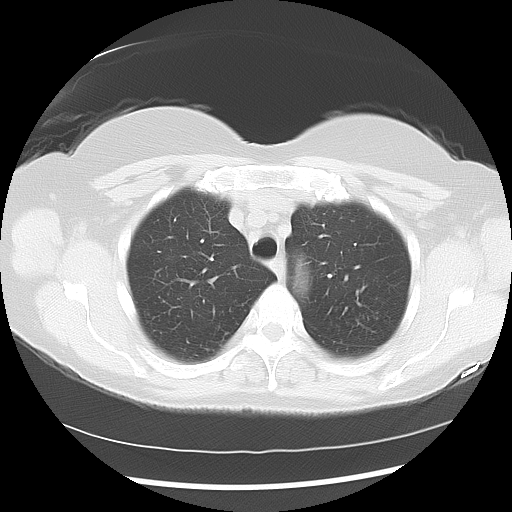

[Series 602: sagittal body · sagittal · 0.70mm/px · 8 of 145 slices shown]
[im 11/145  mediastinal]
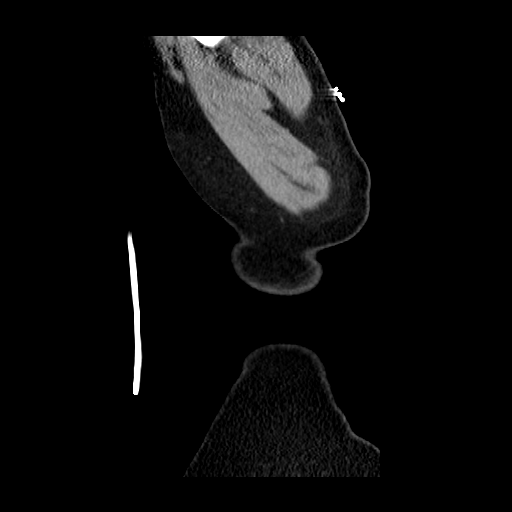
[im 31/145  mediastinal]
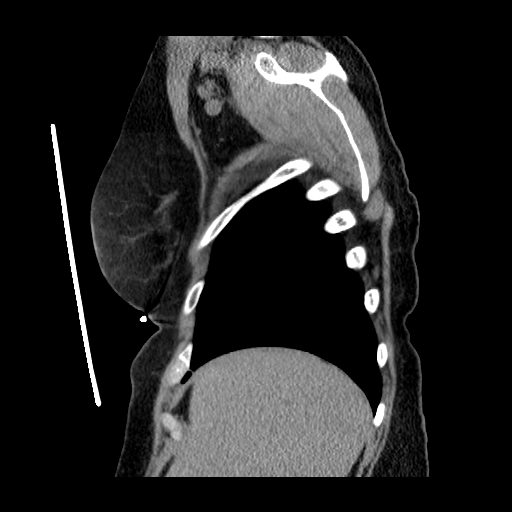
[im 52/145  mediastinal]
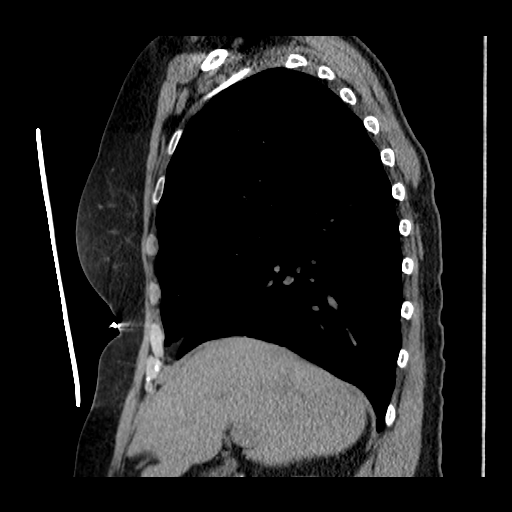
[im 62/145  mediastinal]
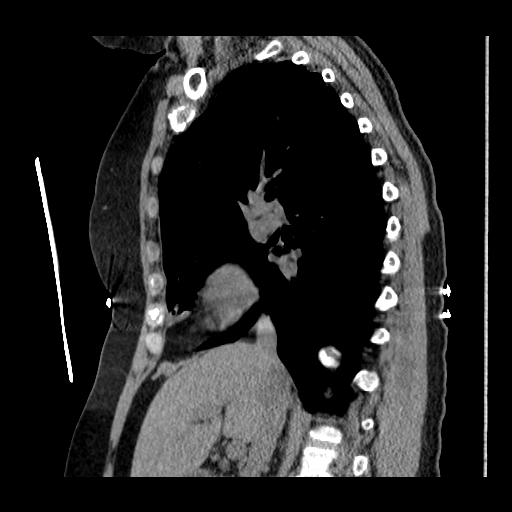
[im 83/145  mediastinal]
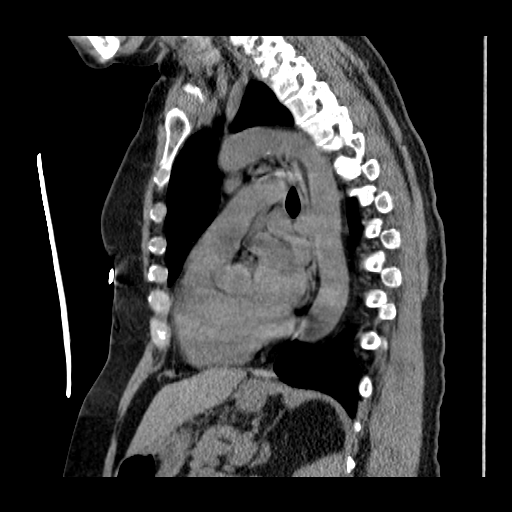
[im 93/145  mediastinal]
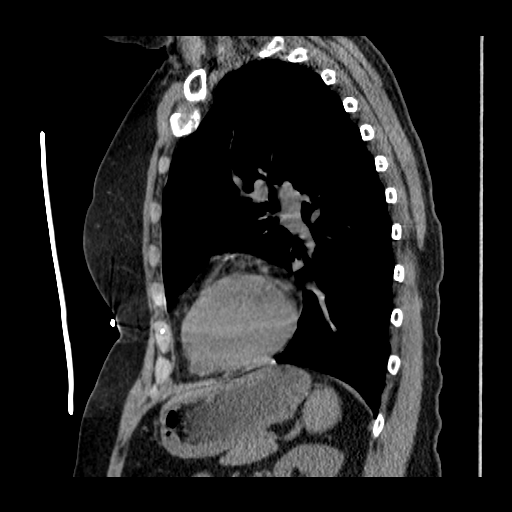
[im 114/145  mediastinal]
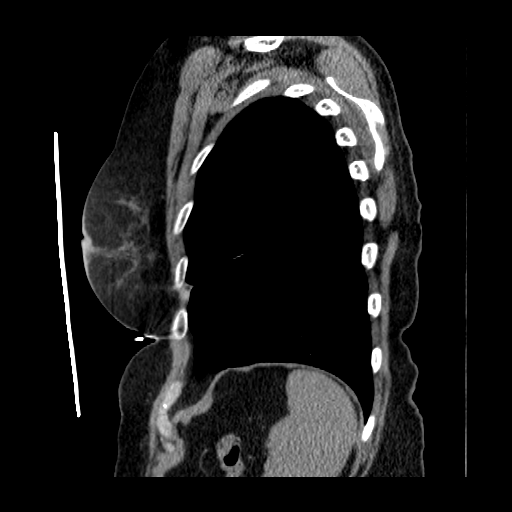
[im 134/145  mediastinal]
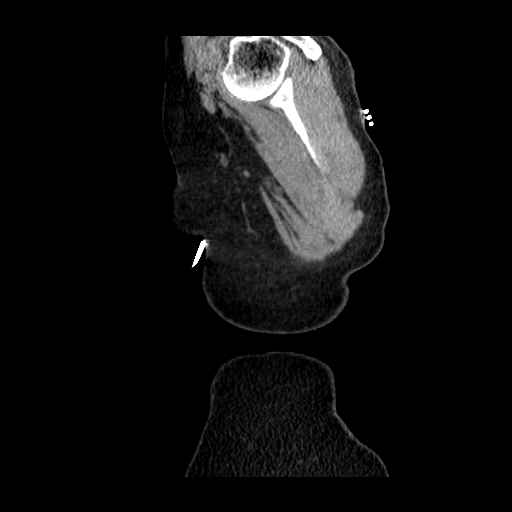

[17 of 30 positions shown; findings below may reference images not displayed]

FINDINGS: There are no enlarged axillary or supraclavicular lymph
nodes.

Borderline enlarged precarinal lymph node measures 10.5 cm.

No pericardial or pleural effusion identified.

The trachea is midline and patent.

No pulmonary masses identified.

Bronchiectasis is noted within the upper lobes, right middle lobe
and lingular portion of the left lung.  There are superimposed
nodular densities identified within both lungs which appear upper
lobe predominant.   Many of these nodular densities have a "tree in
bud configuration".

Mild consolidation is identified within the lingular portion of the
left lung.

Review of the visualized osseous structures shows mild thoracic
spondylosis.

Limited imaging through the upper abdomen is unremarkable.
IMPRESSION: 1.  Constellation of findings include bronchiectasis, clustered
nodularity, tree in bud nodules, and mild lingular consolidation.
Findings are likely due to Mycobacterium avium complex infection.
2.  No specific features identified to suggest malignancy.

## 2012-04-21 ENCOUNTER — Other Ambulatory Visit (HOSPITAL_COMMUNITY)
Admission: RE | Admit: 2012-04-21 | Discharge: 2012-04-21 | Disposition: A | Discharge status: Home / Self Care | Payer: BC Managed Care – PPO | Source: Ambulatory Visit | Attending: Family Medicine | Admitting: Family Medicine

## 2012-04-21 ENCOUNTER — Other Ambulatory Visit: Payer: Self-pay | Admitting: Family Medicine

## 2012-04-21 DIAGNOSIS — Z124 Encounter for screening for malignant neoplasm of cervix: Secondary | ICD-10-CM | POA: Insufficient documentation

## 2012-06-30 ENCOUNTER — Encounter: Payer: Self-pay | Admitting: Neurology

## 2012-06-30 DIAGNOSIS — G20A1 Parkinson's disease without dyskinesia, without mention of fluctuations: Secondary | ICD-10-CM | POA: Insufficient documentation

## 2012-06-30 DIAGNOSIS — I10 Essential (primary) hypertension: Secondary | ICD-10-CM | POA: Insufficient documentation

## 2012-08-18 ENCOUNTER — Ambulatory Visit (INDEPENDENT_AMBULATORY_CARE_PROVIDER_SITE_OTHER): Payer: BC Managed Care – PPO | Admitting: Neurology

## 2012-08-18 ENCOUNTER — Encounter: Payer: Self-pay | Admitting: Neurology

## 2012-08-18 VITALS — BP 133/79 | HR 90 | Ht 66.0 in | Wt 167.0 lb

## 2012-08-18 DIAGNOSIS — I10 Essential (primary) hypertension: Secondary | ICD-10-CM

## 2012-08-18 DIAGNOSIS — F419 Anxiety disorder, unspecified: Secondary | ICD-10-CM

## 2012-08-18 DIAGNOSIS — G20A1 Parkinson's disease without dyskinesia, without mention of fluctuations: Secondary | ICD-10-CM

## 2012-08-18 DIAGNOSIS — G20 Parkinson's disease: Secondary | ICD-10-CM | POA: Insufficient documentation

## 2012-08-18 DIAGNOSIS — F411 Generalized anxiety disorder: Secondary | ICD-10-CM

## 2012-08-18 MED ORDER — ROPINIROLE HCL ER 6 MG PO TB24: 2.0000 | ORAL_TABLET | Freq: Every day | ORAL | Status: DC

## 2012-08-18 NOTE — Progress Notes (Signed)
HPI: Gail Mercado is a 62 yo WF, referred by her primary care Dr.Elizabeth Zachery Mercado, and orthopedic surgeon Dr. Shon Mercado for evaluation of right hand tremor.  She has PMHx of HTN, has gradual onset of right arm clumsiness over past 3 years, initially she attributed it to her fell, but she denies siginificant pain, she has mild right hand tremor, difficult to write on black board, worsening hand writing.  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 She denies gait difficulty  MRI cervical showed mild canal stenosis, and cord deformity at C4-5, C5-6. MRI of the brain showed mild small vessel disease.  he complains of side effects from Requip, including dizziness, low blood pressure, and also correlate with the time that she has a lot of family related stress,   laboratory was normal for TSH, B12 588  UPDATE April 8th 2014:, I have switch her to Requip xr titrating to 2 mg 3 tablets every night, at beginning, she complains of mild dizziness in the morning,  She noticed that she can use her right arm, hand better, hand writing is better, signing name is better, she is hold her arm better, better arm swing, she complains of tension at right side of back, she has called in Hospice care for her mother, who suffered end-stage Alzheimer's disease  Review of Systems  Out of a complete 14 system review, the patient complains of only the following symptoms, and all other reviewed systems are negative.   Constitutional:   N/A Cardiovascular:  N/A Ear/Nose/Throat:  N/A Skin: N/A Eyes: N/A Respiratory: N/A Gastroitestinal: N/A    Hematology/Lymphatic:  N/A Endocrine:  N/A Musculoskeletal:N/A Allergy/Immunology: N/A Neurological: Right hand tremor Psychiatric:    Anxiety, decreased energy   Physical Exam  Neck: supple no carotid bruits Respiratory: clear to auscultation  bilaterally Cardiovascular: regular rate rhythm  Neurologic Exam  Mental Status: pleasant, awake, alert, cooperative to history, talking, and casual conversation. Cranial Nerves: CN II-XII pupils were equal round reactive to light.  Fundi were sharp bilaterally.  Extraocular movements were full.  Visual fields were full on confrontational test.  Facial sensation and strength were normal.  Hearing was intact to finger rubbing bilaterally.  Uvula tongue were midline.  Head turning were normal and symmetric. She has decreased right shoulder shrugging.  Tongue protrusion into the cheeks strength were normal.  Motor: right hand resting tremor, moderate right more than left limb and nuchal rigidity, early fatiguability with rapid wrist opening and closure. no weakness. Sensory: Normal to light touch, pinprick, proprioception, and vibratory sensation. Coordination: Normal finger-to-nose, heel-to-shin.  There was no dysmetria noticed. Gait and Station: decreased right arm swing, right hand tremor, moderate stride, smooth turning, mild retropulse instability.  Romberg sign: Negative Reflexes: Deep tendon reflexes: Biceps: 2/2, Brachioradialis: 2/2, Triceps: 2/2, Pateller: 2/2, Achilles: 2/2.  Plantar responses are flexor.   Assessment plan: 62 years old Caucasian female, with 3 years history of gradual onset, slow worsening right hand tremor, on examination she has right more than left rigidity, bradykinesia consistent with idiopathic Parkinson's disease  1. titrating Requip xr 6mg  to 2 tabs qhs. 2  moderate exercise 3. RTC In in 2-3 months

## 2013-02-16 ENCOUNTER — Encounter: Payer: Self-pay | Admitting: Neurology

## 2013-02-16 ENCOUNTER — Ambulatory Visit (INDEPENDENT_AMBULATORY_CARE_PROVIDER_SITE_OTHER): Payer: BC Managed Care – PPO | Admitting: Neurology

## 2013-02-16 VITALS — BP 155/88 | HR 82 | Ht 65.0 in | Wt 178.0 lb

## 2013-02-16 DIAGNOSIS — G20 Parkinson's disease: Secondary | ICD-10-CM

## 2013-02-16 NOTE — Progress Notes (Signed)
HPI:   Gail Mercado is a 62 yo WF, referred by her primary care Dr.Elizabeth Zachery Dauer, and orthopedic surgeon Dr. Shon Baton for evaluation of right hand tremor.  She has PMHx of HTN, has gradual onset of right arm clumsiness over past 3 years, initially she attributed it to her fell, but she denies siginificant pain, she has mild right hand tremor, difficult to write on black board, worsening hand writing.  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 She denies gait difficulty  MRI cervical showed mild canal stenosis, and cord deformity at C4-5, C5-6. MRI of the brain showed mild small vessel disease.  Laboratory was normal for TSH, B12 588  On examination, she has mild rigidity, bradykinesia, consistent with idiopathic Parkinson's disease, I have started Requip regular form, she complains of significant side effect, including dizziness, low blood pressure, and also correlate with the time that she has a lot of family related stress,   I have switch her to Requip xr titrating to 2 mg 3 tablets every night since 2014, at beginning, she complains of mild dizziness in the morning,  She noticed that she can use her right arm, hand better, hand writing is better, signing name is better, she is hold her arm better, better arm swing,   UPDATE 02/16/2013: She is on thyroid supplement now, feeling better.  She is taking Requip xr 6mg  2 tabs qhs, initially she had nause, now has improved, she is under a lot of stress, taking care of her mother, who suffered end-stage Alzheimer's disease, living at a facility, had recent fall, she does not want to consider Azilect at this point,   Review of Systems  Out of a complete 14 system review, the patient complains of only the following symptoms, and all other reviewed systems are negative.  Constipation, anxiety, weight gain, fatigue,  Physical Exam   Neck: supple no carotid bruits Respiratory: clear to auscultation bilaterally Cardiovascular: regular rate rhythm  Neurologic Exam  Mental Status: pleasant, awake, alert, cooperative to history, talking, and casual conversation. Cranial Nerves: CN II-XII pupils were equal round reactive to light.  Fundi were sharp bilaterally.  Extraocular movements were full.  Visual fields were full on confrontational test.  Facial sensation and strength were normal.  Hearing was intact to finger rubbing bilaterally.  Uvula tongue were midline.  Head turning were normal and symmetric. She has decreased right shoulder shrugging.  Tongue protrusion into the cheeks strength were normal.  Motor: Right hand resting tremor, moderate right more than left limb and nuchal rigidity, early fatiguability with rapid wrist opening and closure. no weakness. Sensory: Normal to light touch, pinprick, proprioception, and vibratory sensation. Coordination: Normal finger-to-nose, heel-to-shin.  There was no dysmetria noticed. Gait and Station: decreased right arm swing, right hand tremor, moderate stride, smooth turning, mild retropulse instability.  Romberg sign: Negative Reflexes: Deep tendon reflexes: Biceps: 2/2, Brachioradialis: 2/2, Triceps: 2/2, Pateller: 2/2, Achilles: 2/2.  Plantar responses are flexor.  Assessment plan: 62 years old Caucasian female, with 3 years history of gradual onset, slow worsening right hand tremor, on examination she has right more than left rigidity, bradykinesia consistent with idiopathic Parkinson's disease  1. Keep Requip xr 6mg  to 2 tabs qhs. 2  moderate exercise 3. RTC In in 6 months

## 2013-02-18 ENCOUNTER — Encounter: Payer: Self-pay | Admitting: Neurology

## 2013-02-23 ENCOUNTER — Telehealth: Payer: Self-pay | Admitting: *Deleted

## 2013-02-23 NOTE — Telephone Encounter (Signed)
Please advise if azilect would be in conjunction or instead of requip.

## 2013-02-23 NOTE — Telephone Encounter (Signed)
Please call patient, she should take both Azilect and requip

## 2013-03-02 NOTE — Telephone Encounter (Signed)
I called and LMVM on cell # that both medications should be taken.  She is to call back if questions.

## 2013-03-17 ENCOUNTER — Other Ambulatory Visit: Payer: Self-pay

## 2013-05-23 ENCOUNTER — Encounter: Payer: Self-pay | Admitting: Neurology

## 2013-05-23 MED ORDER — RASAGILINE MESYLATE 1 MG PO TABS: 1.0000 mg | ORAL_TABLET | Freq: Every day | ORAL | Status: DC

## 2013-05-23 NOTE — Telephone Encounter (Signed)
I have called her, azilect should have generic now, rasagiline 1mg  qday

## 2013-06-02 ENCOUNTER — Encounter: Payer: Self-pay | Admitting: Neurology

## 2013-08-25 ENCOUNTER — Other Ambulatory Visit: Payer: Self-pay | Admitting: Neurology

## 2013-09-07 ENCOUNTER — Ambulatory Visit (INDEPENDENT_AMBULATORY_CARE_PROVIDER_SITE_OTHER): Payer: BC Managed Care – PPO | Admitting: Neurology

## 2013-09-07 ENCOUNTER — Encounter: Payer: Self-pay | Admitting: Neurology

## 2013-09-07 VITALS — BP 176/91 | HR 97 | Ht 65.0 in | Wt 177.0 lb

## 2013-09-07 DIAGNOSIS — M545 Low back pain, unspecified: Secondary | ICD-10-CM

## 2013-09-07 DIAGNOSIS — M5416 Radiculopathy, lumbar region: Secondary | ICD-10-CM | POA: Insufficient documentation

## 2013-09-07 DIAGNOSIS — IMO0002 Reserved for concepts with insufficient information to code with codable children: Secondary | ICD-10-CM

## 2013-09-07 MED ORDER — ROPINIROLE HCL ER 8 MG PO TB24: 16.0000 mg | ORAL_TABLET | Freq: Every day | ORAL | Status: DC

## 2013-09-07 MED ORDER — RASAGILINE MESYLATE 1 MG PO TABS: 1.0000 mg | ORAL_TABLET | Freq: Every day | ORAL | Status: DC

## 2013-09-07 NOTE — Progress Notes (Signed)
HPI:   Gail Mercado is a 63 yo WF, referred by her primary care Dr.Elizabeth Zachery DauerBarnes, and orthopedic surgeon Dr. Shon BatonBrooks for evaluation of right hand tremor.  She has PMHx of HTN, has gradual onset of right arm clumsiness over past 3 years, initially she attributed it to her fell, but she denies siginificant pain, she has mild right hand tremor, difficult to write on black board, worsening hand writing.  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 She denies gait difficulty  MRI cervical showed mild canal stenosis, and cord deformity at C4-5, C5-6. MRI of the brain showed mild small vessel disease.  Laboratory was normal for TSH, B12 588  On examination, she has mild rigidity, bradykinesia, consistent with idiopathic Parkinson's disease, I have started Requip regular form, she complains of significant side effect, including dizziness, low blood pressure, and also correlate with the time that she has a lot of family related stress,   I have switch her to Requip xr titrating to 2 mg 3 tablets every night since 2014, at beginning, she complains of mild dizziness in the morning,  She noticed that she can use her right arm, hand better, hand writing is better, signing name is better, she is hold her arm better, better arm swing,   UPDATE April 29th 2015: She is on thyroid supplement now, feeling better.  She is taking Requip xr 6mg  2 tabs qhs, also azilect 1mg  qday, tolerate the medication very well, she also complains of low back pain, radiating pain to her right leg, getting worse after prolonged standing,walking, heavy lifting, she still takes care of her mother,which require lifting.  Review of Systems  Out of a complete 14 system review, the patient complains of only the following symptoms, and all other reviewed systems are negative.  Right-sided low back pain  Physical Exam   Neck: supple no carotid bruits Respiratory: clear to auscultation bilaterally Cardiovascular: regular rate rhythm  Neurologic Exam  Mental Status: pleasant, awake, alert, cooperative to history, talking, and casual conversation. Cranial Nerves: CN II-XII pupils were equal round reactive to light.  Fundi were sharp bilaterally.  Extraocular movements were full.  Visual fields were full on confrontational test.  Facial sensation and strength were normal.  Hearing was intact to finger rubbing bilaterally.  Uvula tongue were midline.  Head turning were normal and symmetric. She has decreased right shoulder shrugging.  Tongue protrusion into the cheeks strength were normal.  Motor: Right hand resting tremor, moderate right more than left limb and nuchal rigidity, early fatiguability with rapid wrist opening and closure. no weakness. Sensory: Normal to light touch, pinprick, proprioception, and vibratory sensation. Coordination: Normal finger-to-nose, heel-to-shin.  There was no dysmetria noticed. Gait and Station: decreased right arm swing, right hand tremor, moderate stride, smooth turning, mild retropulse instability.  Romberg sign: Negative Reflexes: Deep tendon reflexes: Biceps: 2/2, Brachioradialis: 2/2, Triceps: 2/2, Pateller: 2/2, Achilles: 2/2.  Plantar responses are flexor.  Assessment plan: 63 years old Caucasian female, with 3 years history of gradual onset, slow worsening right hand tremor, on examination she has right more than left rigidity, bradykinesia consistent with idiopathic Parkinson's disease, also had symptoms suggestive of right lumbar radiculopathy  1. increase Requip xr 8mg  to 2 tabs qhs, Azilect 1 mg every day, 3 month supply. 2. MRI lumbar, EMG/NCS.

## 2013-11-09 ENCOUNTER — Encounter: Payer: Self-pay | Admitting: Neurology

## 2013-11-09 DIAGNOSIS — M25551 Pain in right hip: Secondary | ICD-10-CM

## 2013-11-10 ENCOUNTER — Telehealth: Payer: Self-pay | Admitting: Neurology

## 2013-11-10 DIAGNOSIS — M5416 Radiculopathy, lumbar region: Secondary | ICD-10-CM

## 2013-11-10 DIAGNOSIS — M545 Low back pain, unspecified: Secondary | ICD-10-CM

## 2013-11-10 MED ORDER — CYCLOBENZAPRINE HCL 5 MG PO TABS: 5.0000 mg | ORAL_TABLET | Freq: Three times a day (TID) | ORAL | Status: DC | PRN

## 2013-11-10 NOTE — Telephone Encounter (Signed)
Ordered Emg/ncs

## 2013-11-10 NOTE — Telephone Encounter (Signed)
I have called [place] SinkRita, she complains of right hip area tightness,   But she has no significant low back pain.   EMG/NCS to rule out right lumbar radiculopathy

## 2014-01-05 ENCOUNTER — Telehealth: Payer: Self-pay | Admitting: Neurology

## 2014-01-05 NOTE — Telephone Encounter (Signed)
Patient stated she's ready proceed with NCV/EMG.  Please call and advise anytime and may leave detailed message vm.

## 2014-01-05 NOTE — Telephone Encounter (Signed)
Will forward message to check out to schedule ncv/emg.

## 2014-01-17 ENCOUNTER — Telehealth: Payer: Self-pay | Admitting: Neurology

## 2014-01-17 NOTE — Telephone Encounter (Signed)
I called and left a message for the patient to callback to the office to schedule her NCV/EMG test.

## 2014-01-17 NOTE — Telephone Encounter (Signed)
Patient hadn't heard back regarding NCV/EMG appointment.  Would like to proceed with MRI.  Please call anytime and may leave detailed message on voicemail.

## 2014-01-18 NOTE — Telephone Encounter (Signed)
Patient called to schedule her NCV/EMG. Patient has been scheduled for Sept. 10,2015.

## 2014-01-19 ENCOUNTER — Ambulatory Visit (INDEPENDENT_AMBULATORY_CARE_PROVIDER_SITE_OTHER): Payer: BC Managed Care – PPO | Admitting: Neurology

## 2014-01-19 ENCOUNTER — Encounter (INDEPENDENT_AMBULATORY_CARE_PROVIDER_SITE_OTHER): Payer: Self-pay

## 2014-01-19 DIAGNOSIS — M25551 Pain in right hip: Secondary | ICD-10-CM

## 2014-01-19 DIAGNOSIS — M545 Low back pain, unspecified: Secondary | ICD-10-CM

## 2014-01-19 DIAGNOSIS — M5416 Radiculopathy, lumbar region: Secondary | ICD-10-CM

## 2014-01-19 DIAGNOSIS — Z0289 Encounter for other administrative examinations: Secondary | ICD-10-CM

## 2014-01-19 NOTE — Procedures (Signed)
   NCS (NERVE CONDUCTION STUDY) WITH EMG (ELECTROMYOGRAPHY) REPORT   STUDY DATE: Sept 10th 2015 PATIENT NAME: Gail Mercado DOB: 20-Dec-1950 MRN: [id number]    TECHNOLOGIST: Gearldine Shown ELECTROMYOGRAPHER: Levert Feinstein M.D.  CLINICAL INFORMATION:  63 years old Caucasian female, with past medical history of lumbar decompression surgery, now presenting with radiating pain to her right buttock region,  On examination: Bilateral lower extremity motor strength was normal, deep tendon reflex was normal and symmetric.  FINDINGS: NERVE CONDUCTION STUDY: Bilateral peroneal sensory responses were normal. Bilateral peroneal, tibial motor responses were normal. Bilateral tibial H. reflexes were normal and symmetric.  Right ulnar sensory and motor responses were normal. Right medial sensory response showed mild to moderately prolonged peak latency, with normal snap amplitude.  Right median motor response showed mildly prolonged distal latency, with normal C. map amplitude, conduction velocity, and F wave latency.   NEEDLE ELECTROMYOGRAPHY: Selected needle examination was performed at right lower extremity muscles, right lumbosacral paraspinal muscles.  Needle examination of right tibialis anterior, tibialis posterior, peroneal longus, medial gastrocnemius, vastus lateralis, biceps femoris short head was normal  There was no spontaneous activity at right lumbosacral paraspinal muscles, right L4, L5, S1.  IMPRESSION:   This is a normal study. There was no electrodiagnostic evidence of large fiber peripheral neuropathy, or right lumbar sacral radiculopathy.   INTERPRETING PHYSICIAN:   Levert Feinstein M.D. Ph.D. [facility] [address], [address] [place], [place] [address] [phone]

## 2014-01-26 ENCOUNTER — Ambulatory Visit (INDEPENDENT_AMBULATORY_CARE_PROVIDER_SITE_OTHER): Payer: BC Managed Care – PPO

## 2014-01-26 DIAGNOSIS — M545 Low back pain, unspecified: Secondary | ICD-10-CM

## 2014-01-26 DIAGNOSIS — IMO0002 Reserved for concepts with insufficient information to code with codable children: Secondary | ICD-10-CM

## 2014-01-26 DIAGNOSIS — M5416 Radiculopathy, lumbar region: Secondary | ICD-10-CM

## 2014-01-27 NOTE — Telephone Encounter (Signed)
I have called:  MRI scan of the lumbar spine showing prominent spondylitic and facet degenerative changes from L2-S1 resulting in moderate foraminal narrowing left more than right at L3-4, L4-5 and L5-S1 but without definite nerve root compression.

## 2014-01-30 ENCOUNTER — Encounter: Payer: Self-pay | Admitting: Neurology

## 2014-01-30 DIAGNOSIS — M545 Low back pain, unspecified: Secondary | ICD-10-CM

## 2014-01-31 ENCOUNTER — Encounter: Payer: BC Managed Care – PPO | Admitting: Neurology

## 2014-02-03 ENCOUNTER — Telehealth: Payer: Self-pay | Admitting: Neurology

## 2014-02-03 NOTE — Telephone Encounter (Signed)
Patient calling to check on the status of her PT referral, please return call and advise.

## 2014-02-07 NOTE — Telephone Encounter (Signed)
Order was resent and I called a left patient a message.

## 2014-02-08 ENCOUNTER — Ambulatory Visit: Payer: BC Managed Care – PPO | Attending: Neurology

## 2014-02-08 DIAGNOSIS — M79609 Pain in unspecified limb: Secondary | ICD-10-CM | POA: Insufficient documentation

## 2014-02-08 DIAGNOSIS — J45909 Unspecified asthma, uncomplicated: Secondary | ICD-10-CM | POA: Insufficient documentation

## 2014-02-08 DIAGNOSIS — I10 Essential (primary) hypertension: Secondary | ICD-10-CM | POA: Insufficient documentation

## 2014-02-08 DIAGNOSIS — M545 Low back pain, unspecified: Secondary | ICD-10-CM | POA: Diagnosis not present

## 2014-02-08 DIAGNOSIS — G20 Parkinson's disease: Secondary | ICD-10-CM | POA: Diagnosis not present

## 2014-02-08 DIAGNOSIS — G20A1 Parkinson's disease without dyskinesia, without mention of fluctuations: Secondary | ICD-10-CM | POA: Insufficient documentation

## 2014-02-08 DIAGNOSIS — IMO0001 Reserved for inherently not codable concepts without codable children: Secondary | ICD-10-CM | POA: Insufficient documentation

## 2014-02-09 DIAGNOSIS — Z0289 Encounter for other administrative examinations: Secondary | ICD-10-CM

## 2014-02-10 ENCOUNTER — Ambulatory Visit: Payer: BC Managed Care – PPO | Admitting: Physical Therapy

## 2014-02-15 ENCOUNTER — Ambulatory Visit: Payer: BC Managed Care – PPO | Attending: Neurology

## 2014-02-15 DIAGNOSIS — M545 Low back pain: Secondary | ICD-10-CM | POA: Diagnosis present

## 2014-02-15 DIAGNOSIS — R258 Other abnormal involuntary movements: Secondary | ICD-10-CM | POA: Diagnosis not present

## 2014-02-15 DIAGNOSIS — R292 Abnormal reflex: Secondary | ICD-10-CM | POA: Diagnosis not present

## 2014-02-15 DIAGNOSIS — R251 Tremor, unspecified: Secondary | ICD-10-CM | POA: Insufficient documentation

## 2014-02-22 ENCOUNTER — Ambulatory Visit: Payer: BC Managed Care – PPO

## 2014-02-22 ENCOUNTER — Encounter: Payer: Self-pay | Admitting: Neurology

## 2014-02-22 ENCOUNTER — Ambulatory Visit (INDEPENDENT_AMBULATORY_CARE_PROVIDER_SITE_OTHER): Payer: BC Managed Care – PPO | Admitting: Neurology

## 2014-02-22 VITALS — BP 134/78 | HR 90 | Ht 65.0 in | Wt 173.0 lb

## 2014-02-22 DIAGNOSIS — M545 Low back pain, unspecified: Secondary | ICD-10-CM

## 2014-02-22 DIAGNOSIS — M25551 Pain in right hip: Secondary | ICD-10-CM

## 2014-02-22 DIAGNOSIS — M5416 Radiculopathy, lumbar region: Secondary | ICD-10-CM

## 2014-02-22 DIAGNOSIS — G20 Parkinson's disease: Secondary | ICD-10-CM

## 2014-02-22 MED ORDER — ROPINIROLE HCL ER 8 MG PO TB24: 16.0000 mg | ORAL_TABLET | Freq: Every day | ORAL | Status: DC

## 2014-02-22 MED ORDER — RASAGILINE MESYLATE 1 MG PO TABS: 1.0000 mg | ORAL_TABLET | Freq: Every day | ORAL | Status: DC

## 2014-02-22 NOTE — Progress Notes (Signed)
HPI:   Gail Mercado is a [age]  yo WF, referred by her primary care Dr.Elizabeth Zachery DauerBarnes, and orthopedic surgeon Dr. Shon BatonBrooks for evaluation of right hand tremor.  She has PMHx of HTN, has gradual onset of right arm clumsiness over past 3 years, initially she attributed it to her fell, but she denies siginificant pain, she has mild right hand tremor, difficult to write on black board, worsening hand writing.  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 She denies gait difficulty  MRI cervical showed mild canal stenosis, and cord deformity at C4-5, C5-6. MRI of the brain showed mild small vessel disease.  Laboratory was normal for TSH, B12 588  On examination, she has mild rigidity, bradykinesia, consistent with idiopathic Parkinson's disease, I have started Requip regular form, she complains of significant side effect, including dizziness, low blood pressure, and also correlate with the time that she has a lot of family related stress,   I have switch her to Requip xr titrating to 2 mg 3 tablets every night since 2014, at beginning, she complains of mild dizziness in the morning,  She noticed that she can use her right arm, hand better, hand writing is better, signing name is better, she is hold her arm better, better arm swing,   UPDATE April 29th 2015: She is on thyroid supplement now, feeling better.  She is taking Requip xr 6mg  2 tabs qhs, also azilect 1mg  qday, tolerate the medication very well, she also complains of low back pain, radiating pain to her right leg, getting worse after prolonged standing,walking, heavy lifting, she still takes care of her mother,which require lifting.  UPDATE Feb 22 2014: She is now taking reprinted xr 8 mg 2 tablets every night, Azilect 1 mg daily, tolerating the medication well, no significant side effect,  We have reviewed MRI of lumbar 1. At  L5-S1: disc bulging, facet hypertrophy, with mild spinal stenosis, severe right and moderate left foraminal stenosis  2. At L4-5: disc bulging and facet hypertrophy with mild spinal stenosis, mild right and moderate left foraminal stenosis  3. At L2-3: disc bulging and facet hypertrophy with mild spinal stenosis and mild biforaminal stenosis  4. At L3-4: disc bulging and facet hypertrophy with mild right and moderate left foraminal stenosis  EMG nerve Conduction study showed no significant abnormalities She has received physical therapy, which has been helpful, also exercise regularly.   Review of Systems  Out of a complete 14 system review, the patient complains of only the following symptoms, and all other reviewed systems are negative.  Right-sided low back pain  Physical Exam  Neck: supple no carotid bruits Respiratory: clear to auscultation bilaterally Cardiovascular: regular rate rhythm  Neurologic Exam  Mental Status: pleasant, awake, alert, cooperative to history, talking, and casual conversation. Cranial Nerves: CN II-XII pupils were equal round reactive to light.  Fundi were sharp bilaterally.  Extraocular movements were full.  Visual fields were full on confrontational test.  Facial sensation and strength were normal.  Hearing was intact to finger rubbing bilaterally.  Uvula tongue were midline.  Head turning were normal and symmetric. She has decreased right shoulder shrugging.  Tongue protrusion into the cheeks strength were normal.  Motor: Right hand resting tremor, moderate right more than left limb and nuchal rigidity, early fatiguability with rapid wrist opening and closure. no weakness. Sensory: Normal to light touch, pinprick,  proprioception, and vibratory sensation. Coordination: Normal finger-to-nose, heel-to-shin.  There was no dysmetria noticed. Gait and Station: decreased right arm swing, right hand tremor, moderate stride, smooth turning, mild retropulse instability.   Romberg sign: Negative Reflexes: Deep tendon reflexes: Biceps: 3/3, Brachioradialis: 3/3, Triceps: 3/3, Pateller: 3/3, Achilles: 2/2.  Plantar responses are flexor.  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 refilled her Requip xr 8mg  to 2 tabs qhs, Azilect 1 mg every day, 3 month supply. 2. return to clinic in 6 months 3. Continue moderate exercise.

## 2014-02-24 ENCOUNTER — Ambulatory Visit: Payer: BC Managed Care – PPO | Admitting: Physical Therapy

## 2014-02-24 DIAGNOSIS — M545 Low back pain: Secondary | ICD-10-CM | POA: Diagnosis not present

## 2014-02-27 ENCOUNTER — Ambulatory Visit: Payer: BC Managed Care – PPO

## 2014-02-27 DIAGNOSIS — M545 Low back pain: Secondary | ICD-10-CM | POA: Diagnosis not present

## 2014-03-01 ENCOUNTER — Ambulatory Visit: Payer: BC Managed Care – PPO

## 2014-03-01 DIAGNOSIS — M545 Low back pain: Secondary | ICD-10-CM | POA: Diagnosis not present

## 2014-03-06 ENCOUNTER — Ambulatory Visit: Payer: BC Managed Care – PPO

## 2014-03-08 ENCOUNTER — Ambulatory Visit: Payer: BC Managed Care – PPO

## 2014-03-08 DIAGNOSIS — M545 Low back pain: Secondary | ICD-10-CM | POA: Diagnosis not present

## 2014-03-10 ENCOUNTER — Ambulatory Visit: Payer: BC Managed Care – PPO

## 2014-03-15 ENCOUNTER — Ambulatory Visit: Payer: BC Managed Care – PPO | Attending: Neurology

## 2014-03-15 DIAGNOSIS — M545 Low back pain: Secondary | ICD-10-CM | POA: Insufficient documentation

## 2014-03-15 DIAGNOSIS — M79671 Pain in right foot: Secondary | ICD-10-CM | POA: Diagnosis not present

## 2014-03-15 NOTE — Therapy (Signed)
Physical Therapy Treatment  Patient Details  Name: Gail Mercado MRN: [id number] Date of Birth: 09/05/1950  Encounter Date: 03/15/2014      PT End of Session - 03/15/14 0908    Visit Number 8   Number of Visits 16   Date for PT Re-Evaluation 04/07/14   PT Start Time 0808   PT Stop Time 0900   PT Time Calculation (min) 52 min      Past Medical History  Diagnosis Date  . Asthma   . Bronchiectasis   . Hypertension   . Anxiety   . Parkinson disease     Past Surgical History  Procedure Laterality Date  . Breast biopsy 1976    . Back surgery in 1996 & 2005      There were no vitals taken for this visit.  Visit Diagnosis:  Foot pain, right  Midline low back pain, with sciatica presence unspecified          Adult PT Treatment/Exercise - 03/15/14 0700    Exercises   Exercises Lumbar;Knee/Hip   Lumbar Exercises: Stretches   Active Hamstring Stretch 3 reps;30 seconds  each leg   Single Knee to Chest Stretch 2 reps;30 seconds  seated   Pelvic Tilt --  2x20 reps   Pelvic Tilt Limitations discussed pressure points with poor sitting posture and how pelvic tilts can relieve pressure on painful areas   Prone on Elbows Stretch 1 rep;30 seconds   Piriformis Stretch 30 seconds;2 reps   Manual Therapy   Manual Therapy Other (comment)   Manual Therapy   Other Manual Therapy Trigger point release  to gluts on the left x8 minutes with pain resolution    Self care: Thorough discussion of pt's new onset of pain last week following long car drive, and bike riding. Discussion of therapist recommendations to prevent these acute exacerbations in the future, discussion of pt's fear of adding more exercises each visit and feeling overwhelmed by the exercises with pt reporting she wants to continue with current HEP, but not add more. I assured pt we will not be adding more exercises. Discussion of pt's limited  Cardiovascular endurance and importance of performing daily walking and/or  elliptical without pain (as previously discussed). Pt reported understanding and appreciation for the feedback.      Education - 03/15/14 0907    Education provided Yes   Education Details see patient instructoins   Education Details Patient   Methods Explanation;Handout   Comprehension Verbalized understanding            PT Long Term Goals - 03/15/14 0918    PT LONG TERM GOAL #1   Title Verbalize understanding of community fitness opportunities, specifically the [employer] classes for Parkinson's Wellness   Time 23   Period Days   Status On-going   PT LONG TERM GOAL #2   Title Report she has begun walking for exercise again, short distances if needed with no greater than a 1 point increase in pain when ambulating   Time 23   Period Days   Status On-going   PT LONG TERM GOAL #3   Title Decrease Oswestry Pain questionaire to 18% disability due to pain.   Time 23   Period Days   Status On-going   PT LONG TERM GOAL #4   Title Demonstrate ability to stand in single leg stance on the LLE without an increase in pain.   Time 23   Period Days   Status On-going            Plan - 03/15/14 0910    Clinical Impression Statement Pt is making progress overall, but has had recent new onset of pain following cycling. Pt is expected to meet long term goals. She met 3/4 short term goals, and was unable to check 1/4 due to time constraints.   Pt will benefit from skilled therapeutic intervention in order to improve on the following deficits Decreased activity tolerance;Decreased range of motion;Decreased strength;Increased muscle spasms;Impaired flexibility;Pain   Rehab Potential Good   Clinical Impairments Affecting Rehab Potential also has Parkinson's   PT Frequency Min 1X/week   PT Duration 8 weeks   PT Treatment/Interventions Therapeutic activities;Therapeutic exercise;Balance training;Neuromuscular re-education;Patient/family education;Manual techniques   PT Plan Next visit: T's U's V's  and other spinal extensor strength        Problem List Patient Active Problem List   Diagnosis Date Noted  . Low back pain 09/07/2013  . Right lumbar radiculopathy 09/07/2013  . Hypertension   . Anxiety   . Parkinson disease   . Unspecified essential hypertension 06/30/2012  . Parkinson's disease (tremor, stiffness, slow motion, unstable posture) 06/30/2012  . PULMONARY NODULE 04/24/2010  . ASTHMA 11/20/2009  . BRONCHIECTASIS W/O ACUTE EXACERBATION 11/20/2009  . DEPRESSION/ANXIETY 11/19/2009                                            Alderson, Jennifer D 03/15/2014, 9:24 AM      

## 2014-03-15 NOTE — Patient Instructions (Signed)
Continue to perform the exercises we previously gave you (you can stop doing the spine roll up). We will not plan to add any new exercises.  Try to walk or use your elliptical regularly to help your endurance.  If you are going to ride your bike, put a gel seat on it, and limit your distance to 1/2 mile the first time you ride. If you have no pain the following day, you may increase your distance distance to 1 mile. Again, if you have no pain the next day you can continue to increase the distance incrementally by 0.5 mile increments.  For long car rides perform the stretches you can do in the car (hamstrings, gluts and cat/cow).

## 2014-03-20 ENCOUNTER — Encounter: Payer: Self-pay | Admitting: Neurology

## 2014-03-22 ENCOUNTER — Ambulatory Visit: Payer: BC Managed Care – PPO

## 2014-03-22 DIAGNOSIS — M79671 Pain in right foot: Secondary | ICD-10-CM | POA: Diagnosis not present

## 2014-03-22 DIAGNOSIS — M545 Low back pain: Secondary | ICD-10-CM

## 2014-03-22 NOTE — Therapy (Signed)
Physical Therapy Treatment  Patient Details  Name: Gail Mercado MRN: [id number] Date of Birth: November 06, 1950  Encounter Date: 03/22/2014      PT End of Session - 03/22/14 0920    Visit Number 9  9/10   Number of Visits 16   PT Start Time 0803   PT Stop Time 0844   PT Time Calculation (min) 41 min   Equipment Utilized During Treatment Gait belt   Activity Tolerance Patient tolerated treatment well   Behavior During Therapy WFL for tasks assessed/performed      Past Medical History  Diagnosis Date  . Asthma   . Bronchiectasis   . Hypertension   . Anxiety   . Parkinson disease     Past Surgical History  Procedure Laterality Date  . Breast biopsy 1976    . Back surgery in 1996 & 2005      There were no vitals taken for this visit.  Visit Diagnosis:  Midline low back pain, with sciatica presence unspecified  Foot pain, right      Subjective Assessment - 03/22/14 0805    Symptoms Pt reported R UE feels progressively weaker, she sent a note to Dr. Krista Mercado and is awaiting a response.  Pt feels good about the exercises but her R LE still hurts at night; especially after cooking and sewing quilt.  She was able to perform exercises and ride her bicycle.           [facility] PT Assessment - 03/22/14 0803    Observation/Other Assessments   Other Surveys  Select   Oswestry Disability Index  24%          [facility] Adult PT Treatment/Exercise - 03/22/14 0803    Ambulation/Gait   Ambulation/Gait Yes   Ambulation/Gait Assistance 5: Supervision   Ambulation/Gait Assistance Details Supervision to ensure safety, as pt reported R LE begins to slow down and "drag" after prolonged periods of ambulation. Pt was able to amb. 117'x6 before R buttock pain incr. to  4/10 after amb and reported pain decr. to 1/10  after lumbar extension x10 and standing rest break.   Ambulation Distance (Feet) 702 Feet   Assistive device None   Gait Pattern Step-through pattern;Decreased dorsiflexion - right   decreased trunk rotation and decr. R dorsiflex. last 100'.   Exercises   Exercises Lumbar;Shoulder   Lumbar Exercises: Stretches   Standing Extension --   Standing Extension Limitations --   Lumbar Exercises: Standing   Other Standing Lumbar Exercises Standing lumbar ext, 2x10 and x5 reps. Pt reported no pain during lumbar ext. prior to amb. and reported decr. in R buttock and lumbar pain after performing lumbar ext. after amb. VC's and demo for technique.    Shoulder Exercises: Standing   Other Standing Exercises T's, U's, and V's standing with trunk flexed over exercise ball, no weights. 3x10 with rest breaks after each set. VC's for technique.          PT Education - 03/22/14 0809    Education provided Yes   Education Details Using  books/stool to rest R LE on while standing and to perform standing lumbar extension x10 to decr. pain after trunk flexion activities.   Person(s) Educated Patient   Methods Explanation;Demonstration   Comprehension Verbalized understanding;Returned demonstration          PT Short Term Goals - 03/22/14 0925    PT SHORT TERM GOAL #4   Title Pt will complete the Oswestry Disability Index  with a  score <26/%.   Baseline scored 24% on 03/21/14 visit.   Time 0   Period Days   Status Achieved          PT Long Term Goals - 03/22/14 0926    PT LONG TERM GOAL #1   Title Verbalize understanding of community fitness opportunities, specifically the [employer] classes for Parkinson's Wellness   Time --  04/07/14   Status On-going   PT LONG TERM GOAL #2   Title Report she has begun walking for exercise again, short distances if needed with no greater than a 1 point increase in pain when ambulating   Time --  04/07/14   Status On-going   PT LONG TERM GOAL #3   Title Decrease Oswestry Pain questionaire to 18% disability due to pain.   Time --  04/07/14   Status On-going   PT LONG TERM GOAL #4   Title Demonstrate ability to stand in single leg stance on  the LLE without an increase in pain.   Time --  04/07/14   Status On-going          Plan - 03/22/14 0920    Clinical Impression Statement Pt demonstrated progress towards goals, as she met goal #4 (ODI goal). Pt reported decr. pain at the end of session today, after performing repeated lumbar extension. Pt continues to be limited by decreased strength, endurance, and pain as she required rest breaks during session. Pt would continue to benefit from skilled therapy to improve functional mobility and to decr. pain.   Pt will benefit from skilled therapeutic intervention in order to improve on the following deficits Abnormal gait;Decreased endurance;Pain;Decreased strength;Impaired flexibility;Decreased balance   Rehab Potential Good   Clinical Impairments Affecting Rehab Potential also has Parkinson's   PT Frequency 2x / week   PT Duration 8 weeks   PT Treatment/Interventions ADLs/Self Care Home Management;Balance training;Therapeutic exercise;Manual techniques;Therapeutic activities;Functional mobility training;Patient/family education;Neuromuscular re-education;Gait training   PT Next Visit Plan Spinal extensor strengthening (T's, U's, V's), core strengthening, lumbar extension movements as needed, endurance training.   Consulted and Agree with Plan of Care Patient        Problem List Patient Active Problem List   Diagnosis Date Noted  . Low back pain 09/07/2013  . Right lumbar radiculopathy 09/07/2013  . Hypertension   . Anxiety   . Parkinson disease   . Unspecified essential hypertension 06/30/2012  . Parkinson's disease (tremor, stiffness, slow motion, unstable posture) 06/30/2012  . PULMONARY NODULE 04/24/2010  . ASTHMA 11/20/2009  . BRONCHIECTASIS W/O ACUTE EXACERBATION 11/20/2009  . DEPRESSION/ANXIETY 11/19/2009                                              [name],[name] 03/22/2014, 9:28 AM      

## 2014-03-23 NOTE — Telephone Encounter (Signed)
I have called [place] SinkRita, she could increase her ropinirole XR 8mg  to 3 tabs qhs.  She is receiving physical therapy.

## 2014-03-29 ENCOUNTER — Ambulatory Visit: Payer: BC Managed Care – PPO

## 2014-03-29 DIAGNOSIS — M545 Low back pain: Secondary | ICD-10-CM

## 2014-03-29 DIAGNOSIS — M79671 Pain in right foot: Secondary | ICD-10-CM

## 2014-03-29 NOTE — Therapy (Signed)
Physical Therapy Treatment  Patient Details  Name: Gail Mercado MRN: [id number] Date of Birth: 1951/01/01  Encounter Date: 03/29/2014      PT End of Session - 03/29/14 0932    Visit Number 10   PT Start Time 0850   PT Stop Time 0933   PT Time Calculation (min) 43 min      Past Medical History  Diagnosis Date  . Asthma   . Bronchiectasis   . Hypertension   . Anxiety   . Parkinson disease     Past Surgical History  Procedure Laterality Date  . Breast biopsy 1976    . Back surgery in 1996 & 2005      There were no vitals taken for this visit.  Visit Diagnosis:  Midline low back pain, with sciatica presence unspecified  Foot pain, right      Subjective Assessment - 03/29/14 0856    Symptoms Overall feeling better, but occasionally has to stop and sit down while shopping due to pain   Currently in Pain? No/denies          [facility] PT Assessment - 03/29/14 0001    Observation/Other Assessments   Other Surveys  --  Oswestry Questionaire   Oswestry Disability Index  14%            PT Education - 03/29/14 0932    Education provided Yes   Education Details see AVS   Person(s) Educated Patient   Methods Explanation;Demonstration;Handout   Comprehension Verbalized understanding     PT Session today: Checked progress toward goals. Re-educated pt on Parkinson's Wellness Recovery exercise classes and future option of a PT episode to address Parkinson's deficits if she has a decline in balance or functional mobility.  Discussed posture and its effects on pt's pain and how to limit those positions to decrease pain episodes.  Pt plans to walk regularly, participate in an easy yoga class in the community and possibly participate in a Parkinson's exercise group class post discharge from PT.  See AVS for other self care discussions.  Manual therapy: joint mobilizations to the left foot and passive stretching of the toe flexors and extensors to decrease  hypomobility and increase comfort of the left foot.  Pt able to stand on LLE without increase in pain after manual therapy performed.  Pt ambulated 1000' outdoors on grass and uneven concrete independently without pain onset.         PT Long Term Goals - 03/29/14 0858    PT LONG TERM GOAL #1   Title Verbalize understanding of community fitness opportunities, specifically the [facility] classes for Parkinson's Wellness   Time --  04/07/14   Status Achieved   PT LONG TERM GOAL #2   Title Report she has begun walking for exercise again, short distances if needed with no greater than a 1 point increase in pain when ambulating   Time --  04/07/14   Status Achieved   PT LONG TERM GOAL #3   Title Decrease Oswestry Pain questionaire to 18% disability due to pain.   Time --  04/07/14   Status Achieved   PT LONG TERM GOAL #4   Title Demonstrate ability to stand in single leg stance on the LLE without an increase in pain.   Time --  04/07/14   Status Achieved          Plan - 03/29/14 0934    Clinical Impression Statement Pt continues to have intermittent onset of pain, which generally  seems to be due to posture as it occurs most when she is bending with a hinge in the low thoracic spine to put on make up. Pt has met all goals and requested to d/c prior to end of plan of care because she will be busy with Thanksgiving festivities.    PT Plan d/c today        Problem List Patient Active Problem List   Diagnosis Date Noted  . Low back pain 09/07/2013  . Right lumbar radiculopathy 09/07/2013  . Hypertension   . Anxiety   . Parkinson disease   . Unspecified essential hypertension 06/30/2012  . Parkinson's disease (tremor, stiffness, slow motion, unstable posture) 06/30/2012  . PULMONARY NODULE 04/24/2010  . ASTHMA 11/20/2009  . BRONCHIECTASIS W/O ACUTE EXACERBATION 11/20/2009  . DEPRESSION/ANXIETY 11/19/2009           PHYSICAL THERAPY DISCHARGE SUMMARY  Visits from  Start of Care: 10  Current functional level related to goals / functional outcomes: Pt met 4/4 long term goals. See details in goal section above.   Remaining deficits: Occasional brief episodes of pain. Pt is able to manage these with posture correction, and stretching.   Education / Equipment: Parkinson's Exercise Class information, home exercise program  Plan: Patient agrees to discharge.  Patient goals were met. Patient is being discharged due to meeting the stated rehab goals.  ?????                                        [name], Anderson Malta D 03/29/2014, 9:59 AM

## 2014-03-29 NOTE — Patient Instructions (Signed)
Try to conserve your energy. If possible, make your chore schedule as efficient as possible, and get help when able so that you can have time to take care of you.  Try to walk for exercise 5 days per week. Build this into your schedule and if you begin to feel hip pain/cramps then walk home and perform your stretches.   Perform the following exercise if you develop foot pain.  PROM: Toe Flexion / Extension   Gently grasp left toes and curl then straighten them. Hold each position 10 seconds. Repeat 10 times per set. Perform as needed, when your foot begins to hurt. http://orth.exer.us/67   Copyright  VHI. All rights reserved.   It has been great working with you!  Remember you can call [phone] for the latest info on the Parkinson's Exercise classes. I highly recommend them!

## 2014-04-05 ENCOUNTER — Ambulatory Visit: Payer: BC Managed Care – PPO

## 2014-04-12 ENCOUNTER — Ambulatory Visit: Payer: BC Managed Care – PPO

## 2014-04-19 ENCOUNTER — Ambulatory Visit: Payer: BC Managed Care – PPO

## 2014-07-21 ENCOUNTER — Other Ambulatory Visit: Payer: Self-pay

## 2014-07-21 MED ORDER — ROPINIROLE HCL ER 8 MG PO TB24: 24.0000 mg | ORAL_TABLET | Freq: Every day | ORAL | Status: DC

## 2014-07-21 NOTE — Telephone Encounter (Signed)
Levert FeinsteinYijun Yan, MD at 03/23/2014 11:06 AM     Status: Signed       Expand All Collapse All   I have called Avon Sinkita, she could increase her ropinirole XR 8mg  to 3 tabs qhs

## 2014-08-02 ENCOUNTER — Other Ambulatory Visit (HOSPITAL_COMMUNITY)
Admission: RE | Admit: 2014-08-02 | Discharge: 2014-08-02 | Disposition: A | Discharge status: Home / Self Care | Payer: BC Managed Care – PPO | Source: Ambulatory Visit | Attending: Family Medicine | Admitting: Family Medicine

## 2014-08-02 ENCOUNTER — Other Ambulatory Visit: Payer: Self-pay | Admitting: Family Medicine

## 2014-08-02 DIAGNOSIS — Z124 Encounter for screening for malignant neoplasm of cervix: Secondary | ICD-10-CM | POA: Insufficient documentation

## 2014-08-03 LAB — CYTOLOGY - PAP

## 2014-08-23 ENCOUNTER — Ambulatory Visit (INDEPENDENT_AMBULATORY_CARE_PROVIDER_SITE_OTHER): Payer: BC Managed Care – PPO | Admitting: Neurology

## 2014-08-23 ENCOUNTER — Encounter: Payer: Self-pay | Admitting: Neurology

## 2014-08-23 VITALS — BP 166/91 | HR 82 | Ht 65.0 in | Wt 174.0 lb

## 2014-08-23 DIAGNOSIS — M545 Low back pain, unspecified: Secondary | ICD-10-CM

## 2014-08-23 DIAGNOSIS — F329 Major depressive disorder, single episode, unspecified: Secondary | ICD-10-CM | POA: Diagnosis not present

## 2014-08-23 DIAGNOSIS — F32A Depression, unspecified: Secondary | ICD-10-CM

## 2014-08-23 DIAGNOSIS — G20 Parkinson's disease: Secondary | ICD-10-CM | POA: Diagnosis not present

## 2014-08-23 MED ORDER — VENLAFAXINE HCL ER 37.5 MG PO CP24: ORAL_CAPSULE | ORAL | Status: DC

## 2014-08-23 NOTE — Progress Notes (Signed)
PATIENT: Gail Mercado DOB: April 06, 1951  HISTORICAL  Gail Mercado is a 69 RH  yo WF, referred by her primary care Dr.Elizabeth Zachery Dauer, and orthopedic surgeon Dr. Shon Baton for evaluation of right hand tremor.  She has PMHx of HTN, has gradual onset of right arm clumsiness over past 3 years, initially she attributed it to her fell, but she denies siginificant pain, she has mild right hand tremor, difficult to write on black board, worsening hand writing.  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 She denies gait difficulty  MRI cervical showed mild canal stenosis, and cord deformity at C4-5, C5-6. MRI of the brain showed mild small vessel disease.  Laboratory was normal for TSH, B12 588  On examination, she has mild rigidity, bradykinesia, consistent with idiopathic Parkinson's disease, I have started Requip regular form, she complains of significant side effect, including dizziness, low blood pressure, and also correlate with the time that she has a lot of family related stress,   I have switch her to Requip xr titrating to 2 mg 3 tablets every night since 2014, at beginning, she complains of mild dizziness in the morning,  She noticed that she can use her right arm, hand better, hand writing is better, signing name is better, she is hold her arm better, better arm swing,   UPDATE April 29th 2015: She is on thyroid supplement now, feeling better.  She is taking Requip xr  2 tabs qhs, also azilect  qday, tolerate the medication very well, she also complains of low back pain, radiating pain to her right leg, getting worse after prolonged standing,walking, heavy lifting, she still takes care of her mother,which require lifting.  UPDATE Feb 22 2014: She is now taking reprinted xr 8 mg 2 tablets every night, Azilect 1 mg daily, tolerating the medication well, no  significant side effect,  We have reviewed MRI of lumbar 1. At L5-S1: disc bulging, facet hypertrophy, with mild spinal stenosis, severe right and moderate left foraminal stenosis  2. At L4-5: disc bulging and facet hypertrophy with mild spinal stenosis, mild right and moderate left foraminal stenosis  3. At L2-3: disc bulging and facet hypertrophy with mild spinal stenosis and mild biforaminal stenosis  4. At L3-4: disc bulging and facet hypertrophy with mild right and moderate left foraminal stenosis  EMG nerve Conduction study showed no significant abnormalities She has received physical therapy, which has been helpful, also exercise regularly.  UPDATE August 23 2014: Her low back pain now has imroved with PT, she complains of depsression anxiety, cries a lot, she is the main caregiver of her mother, who suffered dementia, She also complains of obsessive compulsive thoughts, mild constipation, worry about the possibility of ovarian cancer She is tearful during today's interview, she was given Zoloft 25 mg daily, but made her feel worse,  REVIEW OF SYSTEMS: Full 14 system review of systems performed and notable only for depression, anxiety, low back pain, achy muscles, muscle cramps, walking difficulty, constipation, swollen abdomen, frequent awakening, fatigue,  ALLERGIES: Allergies  Allergen Reactions  . Ciprofloxacin     REACTION: unknown  . Penicillins     REACTION: hives    HOME MEDICATIONS: Current Outpatient Prescriptions  Medication Sig Dispense Refill  . aspirin-acetaminophen-caffeine (EXCEDRIN MIGRAINE) 250-250-65 MG per tablet Take 1 tablet by mouth every 6 (six) hours as needed for pain.    Marland Kitchen  Calcium Carbonate-Vit D-Min (GNP CALCIUM PLUS 600 +D PO) Take 2 tablets by mouth daily.      . Cholecalciferol (VITAMIN D) 2000 UNITS CAPS Take by mouth daily.      . hydrochlorothiazide (HYDRODIURIL) 25 MG tablet Take 25 mg by mouth daily.    Marland Kitchen. levothyroxine (SYNTHROID, LEVOTHROID)  25 MCG tablet Take 25 mcg by mouth daily.    . mometasone-formoterol (DULERA) 100-5 MCG/ACT AERO Inhale 1 puff into the lungs 2 (two) times daily. 1 Inhaler 11  . Omega-3 Fatty Acids (FISH OIL) 1200 MG CAPS Take 1 capsule by mouth daily.      . rasagiline (AZILECT) 1 MG TABS tablet Take 1 tablet (1 mg total) by mouth daily. 90 tablet 3  . rOPINIRole (REQUIP XL) 8 MG 24 hr tablet Take 3 tablets (24 mg total) by mouth at bedtime. 270 tablet 0  . Vitamin D, Ergocalciferol, (DRISDOL) 50000 UNITS CAPS capsule     . vitamin E 400 UNIT capsule Take 400 Units by mouth daily.       No current facility-administered medications for this visit.    PAST MEDICAL HISTORY: Past Medical History  Diagnosis Date  . Asthma   . Bronchiectasis   . Hypertension   . Anxiety   . Parkinson disease     PAST SURGICAL HISTORY: Past Surgical History  Procedure Laterality Date  . Breast biopsy 1976    . Back surgery in 1996 & 2005      FAMILY HISTORY: Family History  Problem Relation Age of Onset  . Heart disease Father   . Dementia Mother   . High blood pressure Mother   . Osteoarthritis Mother   . Arthritis Mother     SOCIAL HISTORY:  History   Social History  . Marital Status: Married    Spouse Name: Onalee HuaDavid  . Number of Children: 1  . Years of Education: masters   Occupational History  .  [employer]    part time   Social History Main Topics  . Smoking status: Former Smoker -- 0.30 packs/day for 10 years    Types: Cigarettes    Quit date: 05/12/1978  . Smokeless tobacco: Never Used  . Alcohol Use: 0.0 oz/week     Comment: occ ETOH  . Drug Use: No  . Sexual Activity: Not on file   Other Topics Concern  . Not on file   Social History Narrative   Patient is retired . Patient works part time at [employer]. Patient has a Scientist, [employer].   Caffeine- One cup daily   Right handed.     PHYSICAL EXAM   Filed Vitals:   08/23/14 0851  BP: 166/91  Pulse: 82  Height: 5\' 5"   (1.651 m)  Weight: 174 lb (78.926 kg)    Not recorded      Body mass index is 28.96 kg/(m^2).  PHYSICAL EXAMNIATION:  Gen: NAD, conversant, well nourised, obese, well groomed                     Cardiovascular: Regular rate rhythm, no peripheral edema, warm, nontender. Eyes: Conjunctivae clear without exudates or hemorrhage Neck: Supple, no carotid bruise. Pulmonary: Clear to auscultation bilaterally   NEUROLOGICAL EXAM:  MENTAL STATUS: Speech:    Speech is normal; fluent and spontaneous with normal comprehension.  Cognition:    The patient is oriented to person, place, and time;     recent and remote memory intact;     language fluent;  normal attention, concentration,     fund of knowledge.  CRANIAL NERVES: CN II: Visual fields are full to confrontation. Fundoscopic exam is normal with sharp discs and no vascular changes. Venous pulsations are present bilaterally. Pupils are 4 mm and briskly reactive to light. Visual acuity is 20/20 bilaterally. CN III, IV, VI: extraocular movement are normal. No ptosis. CN V: Facial sensation is intact to pinprick in all 3 divisions bilaterally. Corneal responses are intact.  CN VII: Face is symmetric with normal eye closure and smile. CN VIII: Hearing is normal to rubbing fingers CN IX, X: Palate elevates symmetrically. Phonation is normal. CN XI: Head turning and shoulder shrug are intact CN XII: Tongue is midline with normal movements and no atrophy.  MOTOR:  She has mild right hand resting tremor, moderate right more than left limb, and nuchal bradykinesia, rigidity, increased with reinforcement maneuver, normal strength  ,  Shoulder abduction Shoulder external rotation Elbow flexion Elbow extension Wrist flexion Wrist extension Finger abduction Hip flexion Knee flexion Knee extension Ankle dorsi flexion Ankle plantar flexion  R L REFLEXES: Reflexes are 2+ and symmetric at  the biceps, triceps, knees, and ankles. Plantar responses are flexor.  SENSORY: Light touch, pinprick, position sense, and vibration sense are intact in fingers and toes.  COORDINATION: Rapid alternating movements and fine finger movements are intact. There is no dysmetria on finger-to-nose and heel-knee-shin. There are no abnormal or extraneous movements.   GAIT/STANCE: Decreased right arm swing, good stride, smooth turning Romberg is absent.   DIAGNOSTIC DATA (LABS, IMAGING, TESTING) - I reviewed patient records, labs, notes, testing and imaging myself where available.  ASSESSMENT AND PLAN  JAMIE-LEE GALDAMEZ is a 64 y.o. female  with history of idiopathic Parkinson's disease, taking Requip XR 8 mg 3 tablets every night, Azilect 1 mg every day, tolerating it well, she presented with depression, anxiety  1. Continue current Requip, Azilect, 2, Effexor XR 37.5 mg daily, titrating to 2 tabs qam 3. I  have encouraged her moderate exercise, return to clinic in 2 months.  Levert Feinstein, M.D. Ph.D.  [facility] [address], [address] [place], [place] [address] Ph: [phone] Fax: [phone]

## 2014-09-22 ENCOUNTER — Encounter: Payer: Self-pay | Admitting: Neurology

## 2014-10-14 ENCOUNTER — Other Ambulatory Visit: Payer: Self-pay | Admitting: Neurology

## 2014-10-17 ENCOUNTER — Ambulatory Visit (INDEPENDENT_AMBULATORY_CARE_PROVIDER_SITE_OTHER): Payer: BC Managed Care – PPO | Admitting: Neurology

## 2014-10-17 ENCOUNTER — Encounter: Payer: Self-pay | Admitting: Neurology

## 2014-10-17 VITALS — BP 157/90 | HR 85 | Resp 16 | Ht 65.0 in | Wt 176.0 lb

## 2014-10-17 DIAGNOSIS — M545 Low back pain, unspecified: Secondary | ICD-10-CM

## 2014-10-17 DIAGNOSIS — G20 Parkinson's disease: Secondary | ICD-10-CM | POA: Diagnosis not present

## 2014-10-17 DIAGNOSIS — F32A Depression, unspecified: Secondary | ICD-10-CM

## 2014-10-17 DIAGNOSIS — F329 Major depressive disorder, single episode, unspecified: Secondary | ICD-10-CM

## 2014-10-17 MED ORDER — RASAGILINE MESYLATE 1 MG PO TABS: 1.0000 mg | ORAL_TABLET | Freq: Every day | ORAL | Status: DC

## 2014-10-17 MED ORDER — ROPINIROLE HCL ER 8 MG PO TB24: 24.0000 mg | ORAL_TABLET | Freq: Every day | ORAL | Status: DC

## 2014-10-17 NOTE — Progress Notes (Signed)
PATIENT: Gail Mercado DOB: 06/10/1950  HISTORICAL  Gail Mercado is a 80 RH  yo WF, referred by her primary care Dr.Elizabeth Zachery Dauer, and orthopedic surgeon Dr. Shon Baton for evaluation of right hand tremor.  She has PMHx of HTN, has gradual onset of right arm clumsiness over past 3 years, initially she attributed it to her fell, but she denies siginificant pain, she has mild right hand tremor, difficult to write on black board, worsening hand writing.  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 She denies gait difficulty  MRI cervical showed mild canal stenosis, and cord deformity at C4-5, C5-6. MRI of the brain showed mild small vessel disease.  Laboratory was normal for TSH, B12 588  On examination, she has mild rigidity, bradykinesia, consistent with idiopathic Parkinson's disease, I have started Requip regular form, she complains of significant side effect, including dizziness, low blood pressure, and also correlate with the time that she has a lot of family related stress,   I have switch her to Requip xr titrating to 2 mg 3 tablets every night since 2014, at beginning, she complains of mild dizziness in the morning,  She noticed that she can use her right arm, hand better, hand writing is better, signing name is better, she is hold her arm better, better arm swing,   UPDATE April 29th 2015: She is on thyroid supplement now, feeling better.  She is taking Requip xr  2 tabs qhs, also azilect  qday, tolerate the medication very well, she also complains of low back pain, radiating pain to her right leg, getting worse after prolonged standing,walking, heavy lifting, she still takes care of her mother,which require lifting.  UPDATE Feb 22 2014: She is now taking reprinted xr 8 mg 2 tablets every night, Azilect 1 mg daily, tolerating the medication well, no  significant side effect,  We have reviewed MRI of lumbar 1. At L5-S1: disc bulging, facet hypertrophy, with mild spinal stenosis, severe right and moderate left foraminal stenosis  2. At L4-5: disc bulging and facet hypertrophy with mild spinal stenosis, mild right and moderate left foraminal stenosis  3. At L2-3: disc bulging and facet hypertrophy with mild spinal stenosis and mild biforaminal stenosis  4. At L3-4: disc bulging and facet hypertrophy with mild right and moderate left foraminal stenosis  EMG nerve Conduction study showed no significant abnormalities She has received physical therapy, which has been helpful, also exercise regularly.  UPDATE August 23 2014: Her low back pain now has imroved with PT, she complains of depression anxiety, cries a lot, she is the main caregiver of her mother, who suffered dementia, She also complains of obsessive compulsive thoughts, mild constipation, worry about the possibility of ovarian cancer She is tearful during today's interview, she was given Zoloft 25 mg daily, but made her feel worse,  UPDATE June 7th 2016: Her mood has much improved since she started taking Effexor now she is taking 37.5 milligrams 2 tablets every morning,she has mild constipation, is taking Linzess, which has been very helpful as well, he denies significant gait difficulty, continue have low back pain, she visit her mother daily  REVIEW OF SYSTEMS: Full 14 system review of systems performed and notable only for aching muscles, tremor, constipation  ALLERGIES: Allergies  Allergen Reactions  . Ciprofloxacin     REACTION: unknown  . Penicillins  REACTION: hives    HOME MEDICATIONS: Current Outpatient Prescriptions  Medication Sig Dispense Refill  . aspirin-acetaminophen-caffeine (EXCEDRIN MIGRAINE) 250-250-65 MG per tablet Take 1 tablet by mouth every 6 (six) hours as needed for pain.    . Calcium Carbonate-Vit D-Min (GNP CALCIUM PLUS 600 +D PO) Take 2 tablets  by mouth daily.      . Cholecalciferol (VITAMIN D) 2000 UNITS CAPS Take 4,000 Units by mouth daily.     . hydrochlorothiazide (HYDRODIURIL) 25 MG tablet Take 25 mg by mouth daily.    Marland Kitchen. levothyroxine (SYNTHROID, LEVOTHROID) 25 MCG tablet Take 25 mcg by mouth daily.    . Linaclotide (LINZESS PO) Take by mouth.    . mometasone-formoterol (DULERA) 100-5 MCG/ACT AERO Inhale 1 puff into the lungs 2 (two) times daily. 1 Inhaler 11  . Omega-3 Fatty Acids (FISH OIL) 1200 MG CAPS Take 1 capsule by mouth daily.      . rasagiline (AZILECT) 1 MG TABS tablet Take 1 tablet (1 mg total) by mouth daily. 90 tablet 3  . rOPINIRole (REQUIP XL) 8 MG 24 hr tablet TAKE 3 TABLETS (24 MG TOTAL) BY MOUTH AT BEDTIME. 270 tablet 0  . tiZANidine (ZANAFLEX) 2 MG tablet     . venlafaxine XR (EFFEXOR XR) 37.5 MG 24 hr capsule One po qam xone week, then 2 tabs po qam 60 capsule 11  . vitamin E 400 UNIT capsule Take 400 Units by mouth daily.       No current facility-administered medications for this visit.    PAST MEDICAL HISTORY: Past Medical History  Diagnosis Date  . Asthma   . Bronchiectasis   . Hypertension   . Anxiety   . Parkinson disease     PAST SURGICAL HISTORY: Past Surgical History  Procedure Laterality Date  . Breast biopsy 1976    . Back surgery in 1996 & 2005      FAMILY HISTORY: Family History  Problem Relation Age of Onset  . Heart disease Father   . Dementia Mother   . High blood pressure Mother   . Osteoarthritis Mother   . Arthritis Mother     SOCIAL HISTORY:  History   Social History  . Marital Status: Married    Spouse Name: Onalee HuaDavid  . Number of Children: 1  . Years of Education: masters   Occupational History  .  [employer]    part time   Social History Main Topics  . Smoking status: Former Smoker -- 0.30 packs/day for 10 years    Types: Cigarettes    Quit date: 05/12/1978  . Smokeless tobacco: Never Used  . Alcohol Use: 0.0 oz/week     Comment: occ ETOH  .  Drug Use: No  . Sexual Activity: Not on file   Other Topics Concern  . Not on file   Social History Narrative   Patient is retired . Patient works part time at [employer]. Patient has a Scientist, [employer].   Caffeine- One cup daily   Right handed.     PHYSICAL EXAM   Filed Vitals:   10/17/14 1515  BP: 157/90  Pulse: 85  Resp: 16  Height: 5\' 5"  (1.651 m)  Weight: 176 lb (79.833 kg)    Not recorded      Body mass index is 29.29 kg/(m^2).  PHYSICAL EXAMNIATION:  Gen: NAD, conversant, well nourised, obese, well groomed  Cardiovascular: Regular rate rhythm, no peripheral edema, warm, nontender. Eyes: Conjunctivae clear without exudates or hemorrhage Neck: Supple, no carotid bruise. Pulmonary: Clear to auscultation bilaterally   NEUROLOGICAL EXAM:  MENTAL STATUS: Speech:    Speech is normal; fluent and spontaneous with normal comprehension.  Cognition:    The patient is oriented to person, place, and time;     recent and remote memory intact;     language fluent;     normal attention, concentration,     fund of knowledge.  CRANIAL NERVES: CN II: Visual fields are full to confrontation. Fundoscopic exam is normal with sharp discs and no vascular changes.  Pupils are 4 mm and briskly reactive to light.  CN III, IV, VI: extraocular movement are normal. No ptosis. CN V: Facial sensation is intact to pinprick in all 3 divisions bilaterally. Corneal responses are intact.  CN VII: Face is symmetric with normal eye closure and smile. CN VIII: Hearing is normal to rubbing fingers CN IX, X: Palate elevates symmetrically. Phonation is normal. CN XI: Head turning and shoulder shrug are intact CN XII: Tongue is midline with normal movements and no atrophy.  MOTOR:  She has mild right hand resting tremor, moderate right more than left limb, and nuchal bradykinesia, rigidity, increased with reinforcement maneuver, normal strength  REFLEXES: Reflexes are 2+  and symmetric at the biceps, triceps, knees, and ankles. Plantar responses are flexor.  SENSORY: Light touch, pinprick, position sense, and vibration sense are intact in fingers and toes.  COORDINATION: Rapid alternating movements and fine finger movements are intact. There is no dysmetria on finger-to-nose and heel-knee-shin. There are no abnormal or extraneous movements.   GAIT/STANCE: Decreased right arm swing, good stride, smooth turning Romberg is absent.   DIAGNOSTIC DATA (LABS, IMAGING, TESTING) - I reviewed patient records, labs, notes, testing and imaging myself where available.  ASSESSMENT AND PLAN  Gail Mercado is a 64 y.o. female  with history of idiopathic Parkinson's disease, taking Requip XR 8 mg 3 tablets every night, Azilect 1 mg every day, tolerating it well,  1. Continue current Requip, Azilect, 2, depression anxietyhas much improved with Effexor XR 37.5 mg 2 tablets every day 3, low back pain, lumbar radiculopathy, stable 3. I  have encouraged her moderate exercise, return to clinic in 6 months with nurse practitioner  Levert Feinstein, M.D. Ph.D.  [facility] [address] when he is [place], [place] [address] Ph: [phone] Fax: [phone]

## 2014-10-18 ENCOUNTER — Ambulatory Visit: Payer: BC Managed Care – PPO | Admitting: Neurology

## 2014-11-09 ENCOUNTER — Ambulatory Visit: Payer: Self-pay | Admitting: Neurology

## 2014-12-11 ENCOUNTER — Encounter: Payer: Self-pay | Admitting: Neurology

## 2014-12-14 ENCOUNTER — Encounter: Payer: Self-pay | Admitting: Podiatry

## 2014-12-14 ENCOUNTER — Ambulatory Visit (INDEPENDENT_AMBULATORY_CARE_PROVIDER_SITE_OTHER): Payer: BC Managed Care – PPO | Admitting: Podiatry

## 2014-12-14 ENCOUNTER — Ambulatory Visit (INDEPENDENT_AMBULATORY_CARE_PROVIDER_SITE_OTHER): Payer: BC Managed Care – PPO

## 2014-12-14 VITALS — BP 159/85 | HR 88

## 2014-12-14 DIAGNOSIS — Z0189 Encounter for other specified special examinations: Secondary | ICD-10-CM

## 2014-12-14 DIAGNOSIS — M2041 Other hammer toe(s) (acquired), right foot: Secondary | ICD-10-CM

## 2014-12-14 DIAGNOSIS — M2042 Other hammer toe(s) (acquired), left foot: Secondary | ICD-10-CM

## 2014-12-14 NOTE — Progress Notes (Signed)
   Subjective:    Patient ID: Gail Mercado, female    DOB: 10/27/1950, 64 y.o.   MRN: [id number]  HPI I DROPPED SOMETHING ON MY 2ND TOE ON MY LEFT FOOT BACK IN 2015 AND IS SORE AND TENDER AND ABOUT A MONTH AGO THE BALL OF MY LEFT FOOT STARTED HURTING AND I HAVE MY INSERTS AND FEELS LIKE A SHARP PAIN AND HURTS ON THE BOTTOM    Review of Systems  All other systems reviewed and are negative.      Objective:   Physical Exam        Assessment & Plan:

## 2014-12-14 NOTE — Patient Instructions (Signed)
Pre-Operative Instructions  Congratulations, you have decided to take an important step to improving your quality of life.  You can be assured that the doctors of [facility] will be with you every step of the way.  1. Plan to be at the surgery center/hospital at least 1 (one) hour prior to your scheduled time unless otherwise directed by the surgical center/hospital staff.  You must have a responsible adult accompany you, remain during the surgery and drive you home.  Make sure you have directions to the surgical center/hospital and know how to get there on time. 2. For hospital based surgery you will need to obtain a history and physical form from your family physician within 1 month prior to the date of surgery- we will give you a form for you primary physician.  3. We make every effort to accommodate the date you request for surgery.  There are however, times where surgery dates or times have to be moved.  We will contact you as soon as possible if a change in schedule is required.   4. No Aspirin/Ibuprofen for one week before surgery.  If you are on aspirin, any non-steroidal anti-inflammatory medications (Mobic, Aleve, Ibuprofen) you should stop taking it 7 days prior to your surgery.  You make take Tylenol  For pain prior to surgery.  5. Medications- If you are taking daily heart and blood pressure medications, seizure, reflux, allergy, asthma, anxiety, pain or diabetes medications, make sure the surgery center/hospital is aware before the day of surgery so they may notify you which medications to take or avoid the day of surgery. 6. No food or drink after midnight the night before surgery unless directed otherwise by surgical center/hospital staff. 7. No alcoholic beverages 24 hours prior to surgery.  No smoking 24 hours prior to or 24 hours after surgery. 8. Wear loose pants or shorts- loose enough to fit over bandages, boots, and casts. 9. No slip on shoes, sneakers are best. 10. Bring  your boot with you to the surgery center/hospital.  Also bring crutches or a walker if your physician has prescribed it for you.  If you do not have this equipment, it will be provided for you after surgery. 11. If you have not been contracted by the surgery center/hospital by the day before your surgery, call to confirm the date and time of your surgery. 12. Leave-time from work may vary depending on the type of surgery you have.  Appropriate arrangements should be made prior to surgery with your employer. 13. Prescriptions will be provided immediately following surgery by your doctor.  Have these filled as soon as possible after surgery and take the medication as directed. 14. Remove nail polish on the operative foot. 15. Wash the night before surgery.  The night before surgery wash the foot and leg well with the antibacterial soap provided and water paying special attention to beneath the toenails and in between the toes.  Rinse thoroughly with water and dry well with a towel.  Perform this wash unless told not to do so by your physician.  Enclosed: 1 Ice pack (please put in freezer the night before surgery)   1 Hibiclens skin cleaner   Pre-op Instructions  If you have any questions regarding the instructions, do not hesitate to call our office.  [place]: [address] [place], [place] [address] [phone]  [place]: [address] [place], [place] [address] [phone]  [place]: [address]  [place], [place] [address] [phone]  Dr. Richard   Tuchman DPM, Dr. Norman Regal DPM Dr. Richard Sikora DPM, Dr. M. Todd Hyatt DPM, Dr. Kathryn Egerton DPM 

## 2014-12-16 NOTE — Progress Notes (Signed)
Subjective:     Patient ID: Gail Mercado, female   DOB: 12-07-1950, 64 y.o.   MRN: [id number]  HPI patient presents with a rigidly contracted second digit left that's painful when pressed dorsally and makes shoe gear difficult. Patient states she's tried wider shoes she's tried padding and trimming of lesion without relief of symptoms and the symptoms are getting worse over the last year   Review of Systems  All other systems reviewed and are negative.      Objective:   Physical Exam  Constitutional: She is oriented to person, place, and time.  Cardiovascular: Intact distal pulses.   Musculoskeletal: Normal range of motion.  Neurological: She is oriented to person, place, and time.  Skin: Skin is warm.  Nursing note and vitals reviewed.  neurovascular status intact with muscle within normal limits. Patient is noted to have good digital perfusion is well oriented 3 with normal hair growth and is noted to have a rigidly contracted second digit left which is painful and red on the dorsal surface of the proximal phalangeal joint with keratotic tissue formation that's thin in nature with irritation of the tissue. The third and fourth toes are slowly starting to lift but not significant currently     Assessment:     Probable flexor plate dislocation with rigidly contracted second toe left with irritation and pain at the proximal interphalangeal joint    Plan:     H&P and x-rays reviewed with patient. At this point due to the rigid nature of deformity and the fact it's making the other toes worse gradually and increased pain is occurring we have decided on correction. Patient wants correction and at this time I allowed her to read a consent form for correction discussing all possible alternative treatments and complications. She understands he'll be a pin in the second toe and it may not contact the surface postoperatively and wants surgery and signs consent form. She is given preoperative  instructions and is instructed to call with any questions and is scheduled for outpatient surgery in the next several weeks

## 2015-01-08 ENCOUNTER — Telehealth: Payer: Self-pay | Admitting: *Deleted

## 2015-01-08 NOTE — Telephone Encounter (Signed)
"  I have surgery scheduled for tomorrow at 11:45am.  I'm just calling to confirm that.  Please give me a call back.  Thank you."

## 2015-01-09 ENCOUNTER — Encounter: Payer: Self-pay | Admitting: *Deleted

## 2015-01-09 DIAGNOSIS — M2042 Other hammer toe(s) (acquired), left foot: Secondary | ICD-10-CM | POA: Diagnosis not present

## 2015-01-09 HISTORY — PX: TOE SURGERY: SHX1073

## 2015-01-16 ENCOUNTER — Ambulatory Visit (INDEPENDENT_AMBULATORY_CARE_PROVIDER_SITE_OTHER): Payer: BC Managed Care – PPO | Admitting: Podiatry

## 2015-01-16 ENCOUNTER — Encounter: Payer: Self-pay | Admitting: Podiatry

## 2015-01-16 ENCOUNTER — Telehealth: Payer: Self-pay | Admitting: Podiatry

## 2015-01-16 ENCOUNTER — Ambulatory Visit (INDEPENDENT_AMBULATORY_CARE_PROVIDER_SITE_OTHER): Payer: BC Managed Care – PPO

## 2015-01-16 ENCOUNTER — Telehealth: Payer: Self-pay | Admitting: *Deleted

## 2015-01-16 VITALS — BP 140/87 | HR 86 | Temp 96.2°F | Resp 16

## 2015-01-16 DIAGNOSIS — Z9889 Other specified postprocedural states: Secondary | ICD-10-CM

## 2015-01-16 DIAGNOSIS — M2042 Other hammer toe(s) (acquired), left foot: Secondary | ICD-10-CM | POA: Diagnosis not present

## 2015-01-16 NOTE — Telephone Encounter (Signed)
01/12/2105 Dr.Mayer states pt called surgical pin is coming out of the toe 1".  Dr. Stacie Acres states he called four times without response from pt and call at 800pm to make certain pt was home without response.

## 2015-01-16 NOTE — Telephone Encounter (Signed)
Pt called in stating she had surgery last wed. And it looks like the pen is coming out of her foot and the white cap keeps coming off . She had surgery on her Hammer Toe

## 2015-01-16 NOTE — Telephone Encounter (Signed)
Pt was scheduled to come in to see Dr. Charlsie Merles today.

## 2015-01-16 NOTE — Progress Notes (Signed)
Subjective:     Patient ID: Gail Mercado, female   DOB: 08-22-1950, 64 y.o.   MRN: [id number]  HPI patient states I went to bed and when I walk up the pin was out of my toe by about 1 inch. I've had no other issues and I am having minimal discomfort   Review of Systems     Objective:   Physical Exam Neurovascular status intact muscle strength adequate with pin that has moved in a distal direction probably from trauma in the bed of approximate 1 inch with good structural alignment of the second toe    Assessment:     Doing well after having digital fusion of digits 2 left with pin fixation that unfortunately moved out secondary to trauma in the back    Plan:    reviewed x-rays with patient and went ahead and removed the pin and applied sterile dressing. We will hold the pin in a plantarflexed stable position with dressing which was applied today and she will reappoint 2 weeks for suture removal and we will apply a brace. Patient be seen back earlier if any issues should occur

## 2015-01-17 NOTE — Progress Notes (Signed)
Surgery performed at [facility], Hammer Toe Repair 2nd digit with pin left foot.

## 2015-01-18 ENCOUNTER — Other Ambulatory Visit: Payer: Self-pay

## 2015-01-18 ENCOUNTER — Telehealth: Payer: Self-pay | Admitting: *Deleted

## 2015-01-18 MED ORDER — HYDROCODONE-ACETAMINOPHEN 10-325 MG PO TABS: 1.0000 | ORAL_TABLET | Freq: Three times a day (TID) | ORAL | Status: DC | PRN

## 2015-01-18 NOTE — Telephone Encounter (Signed)
Pt states since the pin was removed from her surgical toe, she has been in excruciating pain, can she have more Hydrocodone.  Dr Charlsie Merles states refill Hydrocodone 10/325mg  #40 1 every 4-6 hours prn foot pain.  Pt denies any redness, swelling or drainage.  Pt is to pick the rx up in the [place] office this afternoon.

## 2015-01-25 ENCOUNTER — Encounter: Payer: Self-pay | Admitting: Podiatry

## 2015-01-25 ENCOUNTER — Ambulatory Visit (INDEPENDENT_AMBULATORY_CARE_PROVIDER_SITE_OTHER): Payer: BC Managed Care – PPO | Admitting: Podiatry

## 2015-01-25 DIAGNOSIS — M2042 Other hammer toe(s) (acquired), left foot: Secondary | ICD-10-CM

## 2015-01-26 NOTE — Progress Notes (Signed)
Subjective:     Patient ID: Gail Mercado, female   DOB: 1951/03/05, 64 y.o.   MRN: [id number]  HPI patient states my left second toe is doing well and it is moderately swollen because the pin was removed earlier and it probably needs to be stabilized his best as possible. Not having pain currently   Review of Systems     Objective:   Physical Exam Neurovascular status intact with moderate edema in the second digit left with mild elevation of the toe noted and again patient not able to utilize the pin as she did lose it in the bed and night    Assessment:     Second toe doing okay with moderate excessive edema    Plan:     Reviewed condition and went ahead and applied compression dressing to the second toe and applied ankle stabilizer with a digital strap to lower the toe and hold it in position. Patient will be seen back in 3 weeks and was given instructions on continued usage of device

## 2015-02-16 ENCOUNTER — Ambulatory Visit (INDEPENDENT_AMBULATORY_CARE_PROVIDER_SITE_OTHER): Payer: BC Managed Care – PPO | Admitting: Podiatry

## 2015-02-16 ENCOUNTER — Ambulatory Visit (INDEPENDENT_AMBULATORY_CARE_PROVIDER_SITE_OTHER): Payer: BC Managed Care – PPO

## 2015-02-16 DIAGNOSIS — Z9889 Other specified postprocedural states: Secondary | ICD-10-CM

## 2015-02-16 DIAGNOSIS — M2042 Other hammer toe(s) (acquired), left foot: Secondary | ICD-10-CM

## 2015-02-17 NOTE — Progress Notes (Signed)
Subjective:     Patient ID: Gail Mercado, female   DOB: 1951-03-09, 64 y.o.   MRN: [id number]  HPI patient states I'm doing okay with my foot with mild swelling and discomfort but overall feeling pretty good   Review of Systems     Objective:   Physical Exam Neurovascular status intact with pin in place second toe wound edges well coapted and mild to moderate edema around the second metatarsal head    Assessment:     Doing well post surgery left foot    Plan:     Pin reviewed and x-rays reviewed. At this time I went ahead and applied sterile dressing to the end of the toe instructed on slight movement of bone and it should heal but it'll take time and to continue to reduce activity and to continue immobilizing the next several weeks. Reappoint in 4 weeks to recheck

## 2015-02-26 ENCOUNTER — Telehealth: Payer: Self-pay | Admitting: *Deleted

## 2015-02-26 NOTE — Telephone Encounter (Addendum)
Pt states DOS 01/09/2015, removed compression dressing and one of the areas of the hammer toe has a drainage, possible infections.  I spoke with pt she states she has covered the area with Neosporin and a bandage, but it looks like a suture was left in.  Pt states she is going to visit her mtr around 300pm today and is going to stop by the [place] office.  I told pt I would inform the [facility]. Pt presents to office to check left 2nd toe.  On inspection the base of the left 2nd to is slightly red, and has a small soft scab area without drainage at this time.  Pt give hx of thick creamy drainage this morning after removing Coflex and hammer toe brace.  I cleansed the toe with betadine and applied a neosporin ointment and fabric bandaid.  I advised pt to perform warm epsom salt soaks in the evening and cover the area with the neosporin dressing.  I will advise Dr. Charlsie Merlesegal and call if change in instructions.

## 2015-03-23 ENCOUNTER — Ambulatory Visit (INDEPENDENT_AMBULATORY_CARE_PROVIDER_SITE_OTHER): Payer: BC Managed Care – PPO

## 2015-03-23 ENCOUNTER — Ambulatory Visit (INDEPENDENT_AMBULATORY_CARE_PROVIDER_SITE_OTHER): Payer: BC Managed Care – PPO | Admitting: Podiatry

## 2015-03-23 DIAGNOSIS — Z9889 Other specified postprocedural states: Secondary | ICD-10-CM | POA: Diagnosis not present

## 2015-03-23 DIAGNOSIS — M2042 Other hammer toe(s) (acquired), left foot: Secondary | ICD-10-CM

## 2015-03-25 NOTE — Progress Notes (Signed)
Subjective:     Patient ID: Gail Mercado, female   DOB: [birth year], 64 y.o.   MRN: [id number]  HPI patient states I'm doing real well with my left toes and they are healing well and I am pleased   Review of Systems     Objective:   Physical Exam Neurovascular status intact with good digital positions left foot with mild edema still present but overall improvement    Assessment:     Doing well post arthroplasty left    Plan:     Final x-rays reviewed and allow patient to return to normal activity and shoe gear as tolerated. Reappoint as needed

## 2015-04-18 ENCOUNTER — Encounter: Payer: Self-pay | Admitting: Adult Health

## 2015-04-18 ENCOUNTER — Ambulatory Visit (INDEPENDENT_AMBULATORY_CARE_PROVIDER_SITE_OTHER): Payer: BC Managed Care – PPO | Admitting: Adult Health

## 2015-04-18 VITALS — BP 150/81 | HR 86 | Ht 65.0 in | Wt 177.0 lb

## 2015-04-18 DIAGNOSIS — G8929 Other chronic pain: Secondary | ICD-10-CM | POA: Diagnosis not present

## 2015-04-18 DIAGNOSIS — G20 Parkinson's disease: Secondary | ICD-10-CM

## 2015-04-18 DIAGNOSIS — M549 Dorsalgia, unspecified: Secondary | ICD-10-CM | POA: Diagnosis not present

## 2015-04-18 NOTE — Progress Notes (Signed)
I have reviewed and agreed above plan. 

## 2015-04-18 NOTE — Patient Instructions (Signed)
Continue Requip and aIle If your symptoms worsen or you develop new symptoms please let us know.

## 2015-04-18 NOTE — Progress Notes (Signed)
PATIENT: Gail Mercado DOB: 1950/07/09  REASON FOR VISIT: follow up- chronic back pain, depression, Parkinson's disease HISTORY FROM: patient  HISTORY OF PRESENT ILLNESS:  Gail Mercado is a 64 year old female with a history of chronic back pain, depression and Parkinson's disease. She returns today for an evaluation. She continues on Requip XL  8 mg 3 tablets at bedtime. As well as Azilect 1 mg daily. She reports that this has been beneficial. She states that her tremor in the right hand may have gotten slightly worse. She states that it only occurs at rest. Denies any changes with her gait or balance. She states that due to her back pain occasionally she will have tightness in the right lower back. She denies any falls. She does not use any assistive device when in ambulating. Denies any trouble swallowing or drooling. Denies any trouble with sleeping. The patient states as far as her back pain she has been using a machine that provide slow passive movement to the lower back. She feels that this has been very beneficial for her. She states that she typically takes ibuprofen if she has severe discomfort that is not relieved with stretching. She also sees a Landchiropractor. She states that the ibuprofen will cause acid reflux and she normally has to take Tums with this medication. She states that her friend let her try ibuprofen with famotidine and that was helpful. She returns today for an evaluation.  HISTORY [name]AN): Gail Mercado is a [age] RH yo WF, referred by her primary care Dr.Elizabeth Zachery DauerBarnes, and orthopedic surgeon Dr. Shon BatonBrooks for evaluation of right hand tremor.  She has PMHx of HTN, has gradual onset of right arm clumsiness over past 3 years, initially she attributed it to her fell, but she denies siginificant pain, she has mild right hand tremor, difficult to write on black board, worsening hand writing.  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 She denies gait difficulty  MRI cervical showed mild canal stenosis, and cord deformity at C4-5, C5-6. MRI of the brain showed mild small vessel disease.  Laboratory was normal for TSH, B12 588  On examination, she has mild rigidity, bradykinesia, consistent with idiopathic Parkinson's disease, I have started Requip regular form, she complains of significant side effect, including dizziness, low blood pressure, and also correlate with the time that she has a lot of family related stress,   I have switch her to Requip xr titrating to 2 mg 3 tablets every night since 2014, at beginning, she complains of mild dizziness in the morning, She noticed that she can use her right arm, hand better, hand writing is better, signing name is better, she is hold her arm better, better arm swing,   UPDATE April 29th 2015: She is on thyroid supplement now, feeling better. She is taking Requip xr 6mg  2 tabs qhs, also azilect 1mg  qday, tolerate the medication very well, she also complains of low back pain, radiating pain to her right leg, getting worse after prolonged standing,walking, heavy lifting, she still takes care of her mother,which require lifting.  UPDATE Feb 22 2014: She is now taking reprinted xr 8 mg 2 tablets every night, Azilect 1 mg daily, tolerating the medication well, no significant side effect,  We have reviewed MRI of lumbar 1. At L5-S1: disc bulging, facet hypertrophy, with mild spinal stenosis, severe right and moderate  left foraminal stenosis  2. At L4-5: disc bulging and facet hypertrophy with mild spinal stenosis, mild right and moderate left foraminal stenosis  3. At L2-3: disc bulging and facet hypertrophy with mild spinal stenosis and mild biforaminal stenosis  4. At L3-4: disc bulging and facet hypertrophy with mild right and moderate left foraminal stenosis  EMG nerve Conduction study showed no  significant abnormalities She has received physical therapy, which has been helpful, also exercise regularly.  UPDATE August 23 2014: Her low back pain now has imroved with PT, she complains of depression anxiety, cries a lot, she is the main caregiver of her mother, who suffered dementia, She also complains of obsessive compulsive thoughts, mild constipation, worry about the possibility of ovarian cancer She is tearful during today's interview, she was given Zoloft 25 mg daily, but made her feel worse,  UPDATE June 7th 2016: Her mood has much improved since she started taking Effexor now she is taking 37.5 milligrams 2 tablets every morning,she has mild constipation, is taking Linzess, which has been very helpful as well, he denies significant gait difficulty, continue have low back pain, she visit her mother daily  REVIEW OF SYSTEMS: Out of a complete 14 system review of symptoms, the patient complains only of the following symptoms, and all other reviewed systems are negative.  ALLERGIES: Allergies  Allergen Reactions  . Ciprofloxacin     REACTION: unknown  . Penicillins     REACTION: hives    HOME MEDICATIONS: Outpatient Prescriptions Prior to Visit  Medication Sig Dispense Refill  . aspirin-acetaminophen-caffeine (EXCEDRIN MIGRAINE) 250-250-65 MG per tablet Take 1 tablet by mouth every 6 (six) hours as needed for pain.    . Calcium Carbonate-Vit D-Min (GNP CALCIUM PLUS 600 +D PO) Take 2 tablets by mouth daily.      . Cholecalciferol (VITAMIN D) 2000 UNITS CAPS Take 4,000 Units by mouth daily.     . hydrochlorothiazide (HYDRODIURIL) 25 MG tablet Take 25 mg by mouth daily.    Marland Kitchen HYDROcodone-acetaminophen (NORCO) 10-325 MG per tablet Take 1 tablet by mouth every 8 (eight) hours as needed. 40 tablet 0  . levothyroxine (SYNTHROID, LEVOTHROID) 25 MCG tablet Take 25 mcg by mouth daily.    . Linaclotide (LINZESS PO) Take by mouth.    Marland Kitchen LINZESS 290 MCG CAPS capsule     .  mometasone-formoterol (DULERA) 100-5 MCG/ACT AERO Inhale 1 puff into the lungs 2 (two) times daily. 1 Inhaler 11  . Omega-3 Fatty Acids (FISH OIL) 1200 MG CAPS Take 1 capsule by mouth daily.      . rasagiline (AZILECT) 1 MG TABS tablet Take 1 tablet (1 mg total) by mouth daily. 90 tablet 3  . rOPINIRole (REQUIP XL) 8 MG 24 hr tablet Take 3 tablets (24 mg total) by mouth at bedtime. 270 tablet 3  . tiZANidine (ZANAFLEX) 2 MG tablet     . venlafaxine XR (EFFEXOR XR) 37.5 MG 24 hr capsule One po qam xone week, then 2 tabs po qam 60 capsule 11  . vitamin E 400 UNIT capsule Take 400 Units by mouth daily.       No facility-administered medications prior to visit.    PAST MEDICAL HISTORY: Past Medical History  Diagnosis Date  . Asthma   . Bronchiectasis   . Hypertension   . Anxiety   . Parkinson disease (HCC)     PAST SURGICAL HISTORY: Past Surgical History  Procedure Laterality Date  . Breast biopsy 1976    .  Back surgery in 1996 & 2005      FAMILY HISTORY: Family History  Problem Relation Age of Onset  . Heart disease Father   . Dementia Mother   . High blood pressure Mother   . Osteoarthritis Mother   . Arthritis Mother     SOCIAL HISTORY: Social History   Social History  . Marital Status: Married    Spouse Name: Onalee Hua  . Number of Children: 1  . Years of Education: masters   Occupational History  .  [employer]    part time   Social History Main Topics  . Smoking status: Former Smoker -- 0.30 packs/day for 10 years    Types: Cigarettes    Quit date: 05/12/1978  . Smokeless tobacco: Never Used  . Alcohol Use: 0.0 oz/week     Comment: occ ETOH  . Drug Use: No  . Sexual Activity: Not on file   Other Topics Concern  . Not on file   Social History Narrative   Patient is retired . Patient works part time at [employer]. Patient has a Scientist, water quality.   Caffeine- One cup daily   Right handed.      PHYSICAL EXAM  Filed Vitals:   04/18/15 1003    BP: 150/81  Pulse: 86  Height:  (1.651 m)  Weight: 177 lb (80.287 kg)   Body mass index is 29.45 kg/(m^2).  Generalized: Well developed, in no acute distress   Neurological examination  Mentation: Alert oriented to time, place, history taking. Follows all commands speech and language fluent Cranial nerve II-XII: Pupils were equal round reactive to light. Extraocular movements were full, visual field were full on confrontational test. Facial sensation and strength were normal. Uvula tongue midline. Head turning and shoulder shrug  were normal and symmetric. Motor: The motor testing reveals 5 over 5 strength of all 4 extremities. Good symmetric motor tone is noted throughout. Resting tremor noted in the right hand- Mild Sensory: Sensory testing is intact to soft touch on all 4 extremities. No evidence of extinction is noted.  Coordination: Cerebellar testing reveals good finger-nose-finger and heel-to-shin bilaterally.  Gait and station: Patient is able to stand from a sitting position without assistance. Her stride is normal however her arm swing is decreased on the right. Tandem gait is normal. Romberg is negative. Reflexes: Deep tendon reflexes are symmetric and normal bilaterally.   DIAGNOSTIC DATA (LABS, IMAGING, TESTING) - I reviewed patient records, labs, notes, testing and imaging myself where available.     ASSESSMENT AND PLAN 64 y.o. year old female  has a past medical history of Asthma; Bronchiectasis; Hypertension; Anxiety; and Parkinson disease (HCC). here with:  1. Parkinson's disease 2. Chronic back pain  Overall the patient feels that she is doing well. She will continue on Requip and Azilect. Patient should continue stretching and using her new movement machine to help with her chronic back pain. Patient advised that if her symptoms worsen or she develops new symptoms she should let us know. She will follow-up in 6 months or sooner if needed.   Butch Penny,  MSN, NP-C 04/18/2015, 10:10 AM [facility] [address], [address] [place], [place] [address] [phone]

## 2015-07-30 ENCOUNTER — Other Ambulatory Visit: Payer: Self-pay | Admitting: Neurology

## 2015-07-31 ENCOUNTER — Other Ambulatory Visit: Payer: Self-pay | Admitting: *Deleted

## 2015-07-31 MED ORDER — VENLAFAXINE HCL ER 75 MG PO CP24: 75.0000 mg | ORAL_CAPSULE | Freq: Every day | ORAL | Status: DC

## 2015-08-14 ENCOUNTER — Other Ambulatory Visit: Payer: Self-pay | Admitting: Neurology

## 2015-10-17 ENCOUNTER — Ambulatory Visit (INDEPENDENT_AMBULATORY_CARE_PROVIDER_SITE_OTHER): Payer: BC Managed Care – PPO | Admitting: Adult Health

## 2015-10-17 ENCOUNTER — Telehealth: Payer: Self-pay | Admitting: Adult Health

## 2015-10-17 ENCOUNTER — Encounter: Payer: Self-pay | Admitting: Adult Health

## 2015-10-17 VITALS — BP 164/90 | HR 88 | Ht 65.0 in | Wt 181.2 lb

## 2015-10-17 DIAGNOSIS — F329 Major depressive disorder, single episode, unspecified: Secondary | ICD-10-CM

## 2015-10-17 DIAGNOSIS — G8929 Other chronic pain: Secondary | ICD-10-CM

## 2015-10-17 DIAGNOSIS — G20 Parkinson's disease: Secondary | ICD-10-CM | POA: Diagnosis not present

## 2015-10-17 DIAGNOSIS — M549 Dorsalgia, unspecified: Secondary | ICD-10-CM

## 2015-10-17 DIAGNOSIS — G20A1 Parkinson's disease without dyskinesia, without mention of fluctuations: Secondary | ICD-10-CM

## 2015-10-17 DIAGNOSIS — F32A Depression, unspecified: Secondary | ICD-10-CM

## 2015-10-17 MED ORDER — RASAGILINE MESYLATE 1 MG PO TABS: 1.0000 mg | ORAL_TABLET | Freq: Every day | ORAL | Status: DC

## 2015-10-17 MED ORDER — ROPINIROLE HCL ER 8 MG PO TB24: 24.0000 mg | ORAL_TABLET | Freq: Every day | ORAL | Status: DC

## 2015-10-17 NOTE — Patient Instructions (Signed)
Continue requip and azilect Follow-up with Dr. Loreta AveMann for constipation If your symptoms worsen or you develop new symptoms please let us know.

## 2015-10-17 NOTE — Progress Notes (Signed)
PATIENT: Gail Mercado DOB: [birth year]  REASON FOR VISIT: follow up- Parkinson's, depression, back pain HISTORY FROM: patient  HISTORY OF PRESENT ILLNESS:  TODAY 10/17/15: Gail Mercado is a 65 year old female with a history of chronic back pain, depression and Parkinson's disease. She returns today for evaluation. She has continued on Requip XL 24 mg daily. She is also Azilect. Patient reports  no change with her gait or balance. She continues to have a tremor that affects the right arm primarily. She reports when she is nervous the tremor is worse. She reports occasionally she can hear a tremor in her voice. She denies any trouble with her swallowing. Denies any trouble with her sleeping. She does state that she tends to stay up late. The patient is reporting that she is under a lot at stress and stays very busy. Her mother is in a care facility and her husband has congestive heart failure. She continues to have back pain that radiates into the right buttocks. She has had a lumbar MRI that did reveal disc bulging and mild to moderate foraminal stenosis. She continues to try to stretch to relieve her back pain. She reports her primary concern is ongoing constipation. She reports that she's had a history of constipation prior to the start of Requip or Azilect. She sees Dr. Loreta AveMann with gastroenterology. She reports that she was started on Linzess but has not seen any benefit. She returns today for an evaluation.    HISTORY 12 /7/16 (MM): Gail Mercado is a 65 year old female with a history of chronic back pain, depression and Parkinson's disease. She returns today for an evaluation. She continues on Requip XL 8 mg 3 tablets at bedtime. As well as Azilect 1 mg daily. She reports that this has been beneficial. She states that her tremor in the right hand may have gotten slightly worse. She states that it only occurs at rest. Denies any changes with her gait or balance. She states that due to her back pain  occasionally she will have tightness in the right lower back. She denies any falls. She does not use any assistive device when in ambulating. Denies any trouble swallowing or drooling. Denies any trouble with sleeping. The patient states as far as her back pain she has been using a machine that provide slow passive movement to the lower back. She feels that this has been very beneficial for her. She states that she typically takes ibuprofen if she has severe discomfort that is not relieved with stretching. She also sees a Landchiropractor. She states that the ibuprofen will cause acid reflux and she normally has to take Tums with this medication. She states that her friend let her try ibuprofen with famotidine and that was helpful. She returns today for an evaluation.  HISTORY [name](YAN): Gail Mercado is a [age] RH yo WF, referred by her primary care Dr.Elizabeth Zachery DauerBarnes, and orthopedic surgeon Dr. Shon BatonBrooks for evaluation of right hand tremor.  She has PMHx of HTN, has gradual onset of right arm clumsiness over past 3 years, initially she attributed it to her fell, but she denies siginificant pain, she has mild right hand tremor, difficult to write on black board, worsening hand writing.  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 She denies gait difficulty  MRI cervical showed mild canal stenosis, and cord deformity at C4-5, C5-6. MRI of the  brain showed mild small vessel disease.  Laboratory was normal for TSH, B12 588  On examination, she has mild rigidity, bradykinesia, consistent with idiopathic Parkinson's disease, I have started Requip regular form, she complains of significant side effect, including dizziness, low blood pressure, and also correlate with the time that she has a lot of family related stress,   I have switch her to Requip xr titrating to 2 mg 3 tablets every night since  2014, at beginning, she complains of mild dizziness in the morning, She noticed that she can use her right arm, hand better, hand writing is better, signing name is better, she is hold her arm better, better arm swing,   UPDATE April 29th 2015: She is on thyroid supplement now, feeling better. She is taking Requip xr 6mg  2 tabs qhs, also azilect 1mg  qday, tolerate the medication very well, she also complains of low back pain, radiating pain to her right leg, getting worse after prolonged standing,walking, heavy lifting, she still takes care of her mother,which require lifting.  UPDATE Feb 22 2014: She is now taking reprinted xr 8 mg 2 tablets every night, Azilect 1 mg daily, tolerating the medication well, no significant side effect,  We have reviewed MRI of lumbar 1. At L5-S1: disc bulging, facet hypertrophy, with mild spinal stenosis, severe right and moderate left foraminal stenosis  2. At L4-5: disc bulging and facet hypertrophy with mild spinal stenosis, mild right and moderate left foraminal stenosis  3. At L2-3: disc bulging and facet hypertrophy with mild spinal stenosis and mild biforaminal stenosis  4. At L3-4: disc bulging and facet hypertrophy with mild right and moderate left foraminal stenosis  EMG nerve Conduction study showed no significant abnormalities She has received physical therapy, which has been helpful, also exercise regularly.  UPDATE August 23 2014: Her low back pain now has imroved with PT, she complains of depression anxiety, cries a lot, she is the main caregiver of her mother, who suffered dementia, She also complains of obsessive compulsive thoughts, mild constipation, worry about the possibility of ovarian cancer She is tearful during today's interview, she was given Zoloft 25 mg daily, but made her feel worse,  UPDATE June 7th 2016: Her mood has much improved since she started taking Effexor now she is taking 37.5 milligrams 2 tablets every morning,she has  mild constipation, is taking Linzess, which has been very helpful as well, he denies significant gait difficulty, continue have low back pain, she visit her mother daily  REVIEW OF SYSTEMS: Out of a complete 14 system review of symptoms, the patient complains only of the following symptoms, and all other reviewed systems are negative.  Swollen abdomen, constipation, insomnia, frequent waking, excessive sweating, back pain, aching muscles, muscle cramps, tremors, nervous/anxious  ALLERGIES: Allergies  Allergen Reactions  . Ciprofloxacin     REACTION: unknown  . Penicillins     REACTION: hives    HOME MEDICATIONS: Outpatient Prescriptions Prior to Visit  Medication Sig Dispense Refill  . aspirin-acetaminophen-caffeine (EXCEDRIN MIGRAINE) 250-250-65 MG per tablet Take 1 tablet by mouth every 6 (six) hours as needed for pain.    . Calcium Carbonate-Vit D-Min (GNP CALCIUM PLUS 600 +D PO) Take 2 tablets by mouth daily.      . Cholecalciferol (VITAMIN D) 2000 UNITS CAPS Take 4,000 Units by mouth daily.     . hydrochlorothiazide (HYDRODIURIL) 25 MG tablet Take 25 mg by mouth daily.    Marland Kitchen levothyroxine (SYNTHROID, LEVOTHROID) 25 MCG tablet Take 25 mcg  by mouth daily.    Marland Kitchen LINZESS 290 MCG CAPS capsule     . mometasone-formoterol (DULERA) 100-5 MCG/ACT AERO Inhale 1 puff into the lungs 2 (two) times daily. 1 Inhaler 11  . Omega-3 Fatty Acids (FISH OIL) 1200 MG CAPS Take 1 capsule by mouth daily.      Marland Kitchen tiZANidine (ZANAFLEX) 2 MG tablet     . venlafaxine XR (EFFEXOR-XR) 75 MG 24 hr capsule Take 1 capsule (75 mg total) by mouth daily with breakfast. (Patient taking differently: Take 150 mg by mouth daily with breakfast. ) 30 capsule 11  . rasagiline (AZILECT) 1 MG TABS tablet Take 1 tablet (1 mg total) by mouth daily. 90 tablet 3  . rOPINIRole (REQUIP XL) 8 MG 24 hr tablet Take 3 tablets (24 mg total) by mouth at bedtime. 270 tablet 3   No facility-administered medications prior to visit.    PAST  MEDICAL HISTORY: Past Medical History  Diagnosis Date  . Asthma   . Bronchiectasis   . Hypertension   . Anxiety   . Parkinson disease (HCC)     PAST SURGICAL HISTORY: Past Surgical History  Procedure Laterality Date  . Breast biopsy 1976    . Back surgery in 1996 & 2005    . Toe surgery Left 01/09/2015  . Cataract extraction, bilateral  05/31/2015, 06-21-15    FAMILY HISTORY: Family History  Problem Relation Age of Onset  . Heart disease Father   . Dementia Mother   . High blood pressure Mother   . Osteoarthritis Mother   . Arthritis Mother     SOCIAL HISTORY: Social History   Social History  . Marital Status: Married    Spouse Name: Onalee Hua  . Number of Children: 1  . Years of Education: masters   Occupational History  .  [employer]    part time   Social History Main Topics  . Smoking status: Former Smoker -- 0.30 packs/day for 10 years    Types: Cigarettes    Quit date: 05/12/1978  . Smokeless tobacco: Never Used  . Alcohol Use: 0.0 oz/week     Comment: occ ETOH  . Drug Use: No  . Sexual Activity: Not on file   Other Topics Concern  . Not on file   Social History Narrative   Patient is retired . Patient works part time at [employer]. Patient has a Scientist, water quality.   Caffeine- One cup daily   Right handed.      PHYSICAL EXAM  Filed Vitals:   10/17/15 1014  BP: 164/90  Pulse: 88  Height: 5\' 5"  (1.651 m)  Weight: 181 lb 3.2 oz (82.192 kg)   Body mass index is 30.15 kg/(m^2).  Generalized: Well developed, in no acute distress  Abdomen: Soft to palpitation.  Neurological examination  Mentation: Alert oriented to time, place, history taking. Follows all commands speech and language fluent Cranial nerve II-XII: Pupils were equal round reactive to light. Extraocular movements were full, visual field were full on confrontational test. Facial sensation and strength were normal. Uvula tongue midline. Head turning and shoulder shrug  were  normal and symmetric. Motor: The motor testing reveals 5 over 5 strength of all 4 extremities. Good symmetric motor tone is noted throughout. Resting tremor in the right arm. Moderate impairment of finger taps on the right and toe taps on the right. Sensory: Sensory testing is intact to soft touch on all 4 extremities. No evidence of extinction is noted.  Coordination: Cerebellar testing  reveals good finger-nose-finger and heel-to-shin bilaterally.  Gait and station: Patient is able to stand without assistance. She has good stride. Good turns. Slightly decreased arm swing on the right. Tandem gait is normal. Romberg is negative. No drift is seen.  Reflexes: Deep tendon reflexes are symmetric and normal bilaterally.   DIAGNOSTIC DATA (LABS, IMAGING, TESTING) - I reviewed patient records, labs, notes, testing and imaging myself where available.     ASSESSMENT AND PLAN 65 y.o. year old female  has a past medical history of Asthma; Bronchiectasis; Hypertension; Anxiety; and Parkinson disease (HCC). here with:  1. Parkinson's disease 2. Chronic back pain 3. Depression  Overall the patient has remained stable. She will remain on Azilect 1 mg daily as well as Requip extended release 24 mg daily. Patient is doing well on Effexor. She will remain on 75 mg daily. Patient advised that if her symptoms worsen or she develops any new symptoms she should let us know. She will follow-up in 6 months with Dr. [name].    Butch Penny, MSN, NP-C 10/17/2015, 1:55 PM [facility] [address] [place], [place] [address] [phone]

## 2015-10-17 NOTE — Telephone Encounter (Signed)
Done

## 2015-10-17 NOTE — Telephone Encounter (Signed)
Patient called, was just seen by NP, Megan. States she forgot to request refill of rOPINIRole (REQUIP XL) 8 MG 24 hr tablet and renewal of prescription for rasagiline (AZILECT) 1 MG TABS tablet for the next year. [facility]acy is [facility] [address].

## 2015-10-30 NOTE — Progress Notes (Signed)
I have reviewed and agreed above plan. 

## 2015-11-05 ENCOUNTER — Encounter: Payer: Self-pay | Admitting: Adult Health

## 2015-11-15 ENCOUNTER — Telehealth: Payer: Self-pay | Admitting: Adult Health

## 2015-11-15 MED ORDER — TIZANIDINE HCL 2 MG PO TABS: 2.0000 mg | ORAL_TABLET | Freq: Four times a day (QID) | ORAL | Status: DC | PRN

## 2015-11-15 NOTE — Telephone Encounter (Signed)
Patient called, is currently out of town, will be back on Sunday, saw Aundra MilletMegan in June, back pain, bulging disk in back has gotten worse, walks a little bit, then has to sit down, goes down right leg. Has had 2 previous back surgeries.  "Muscles are real tight like it's trying to protect something. When pain is there, tremor is worse" What to do?

## 2015-11-15 NOTE — Telephone Encounter (Signed)
I spoke to Gail Mercado. She says that she is having problems walking because of muscle tightness that goes down her right leg. She says it is from a "back problem". The muscle tightness is "impeding my daily life." The pain is getting out of hand and she was wondering if she needs an "injection". I asked Gail Mercado if she has ever had an injection, and she denied ever having one. She tried to make an appt with Dr. Terrace ArabiaYan but was told that Dr. Terrace ArabiaYan was booked until September.  Gail Mercado last saw Aundra MilletMegan, NP and is wondering if Aundra MilletMegan, NP recommends anything for her. Gail Mercado was last seen by Aundra MilletMegan, NP on 10/17/15. Gail Mercado is out of town and is asking that we use her cell phone number to call her back.

## 2015-11-15 NOTE — Telephone Encounter (Signed)
I called the patient. She is having increased sciatica type pain. She does have tizanidine but only takes it at bedtime. I advised that she could take it every 6 hours as needed. If this is not beneficial she will let us know. The patient also went off of Azilect to see if it affected her constipation. She does not feel that it had any effect on constipation and therefore will restart it when she is back in town.

## 2015-11-28 ENCOUNTER — Ambulatory Visit (INDEPENDENT_AMBULATORY_CARE_PROVIDER_SITE_OTHER): Payer: BC Managed Care – PPO | Admitting: Adult Health

## 2015-11-28 ENCOUNTER — Encounter: Payer: Self-pay | Admitting: Adult Health

## 2015-11-28 VITALS — BP 147/85 | HR 47 | Ht 65.0 in | Wt 177.4 lb

## 2015-11-28 DIAGNOSIS — M549 Dorsalgia, unspecified: Secondary | ICD-10-CM

## 2015-11-28 DIAGNOSIS — G8929 Other chronic pain: Secondary | ICD-10-CM

## 2015-11-28 DIAGNOSIS — G20 Parkinson's disease: Secondary | ICD-10-CM

## 2015-11-28 MED ORDER — TIZANIDINE HCL 2 MG PO TABS: 3.0000 mg | ORAL_TABLET | Freq: Three times a day (TID) | ORAL | Status: DC

## 2015-11-28 NOTE — Patient Instructions (Signed)
Increase tizanidine to 1.5 tablets three times a day Continue requip and azilect If your symptoms worsen or you develop new symptoms please let us know.

## 2015-11-28 NOTE — Progress Notes (Signed)
I have reviewed and agreed above plan. 

## 2015-11-28 NOTE — Progress Notes (Signed)
PATIENT: Gail Mercado DOB: 03-10-1951  REASON FOR VISIT: follow up HISTORY FROM: patient  HISTORY OF PRESENT ILLNESS:  HISTORY Gail Mercado): Gail Mercado is a 49 RH yo WF, referred by her primary care Dr.Elizabeth Zachery Dauer, and orthopedic surgeon Dr. Shon Baton for evaluation of right hand tremor.  She has PMHx of HTN, has gradual onset of right arm clumsiness over past 3 years, initially she attributed it to her fell, but she denies siginificant pain, she has mild right hand tremor, difficult to write on black board, worsening hand writing.  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 She denies gait difficulty  MRI cervical showed mild canal stenosis, and cord deformity at C4-5, C5-6. MRI of the brain showed mild small vessel disease.  Laboratory was normal for TSH, B12 588  On examination, she has mild rigidity, bradykinesia, consistent with idiopathic Parkinson's disease, I have started Requip regular form, she complains of significant side effect, including dizziness, low blood pressure, and also correlate with the time that she has a lot of family related stress,   I have switch her to Requip xr titrating to 2 mg 3 tablets every night since 2014, at beginning, she complains of mild dizziness in the morning, She noticed that she can use her right arm, hand better, hand writing is better, signing name is better, she is hold her arm better, better arm swing,   UPDATE April 29th 2015: She is on thyroid supplement now, feeling better. She is taking Requip xr  2 tabs qhs, also azilect  qday, tolerate the medication very well, she also complains of low back pain, radiating pain to her right leg, getting worse after prolonged standing,walking, heavy lifting, she still takes care of her mother,which require lifting.  UPDATE Feb 22 2014: She is now taking reprinted xr 8  mg 2 tablets every night, Azilect 1 mg daily, tolerating the medication well, no significant side effect,  We have reviewed MRI of lumbar 1. At L5-S1: disc bulging, facet hypertrophy, with mild spinal stenosis, severe right and moderate left foraminal stenosis  2. At L4-5: disc bulging and facet hypertrophy with mild spinal stenosis, mild right and moderate left foraminal stenosis  3. At L2-3: disc bulging and facet hypertrophy with mild spinal stenosis and mild biforaminal stenosis  4. At L3-4: disc bulging and facet hypertrophy with mild right and moderate left foraminal stenosis  EMG nerve Conduction study showed no significant abnormalities She has received physical therapy, which has been helpful, also exercise regularly.  UPDATE August 23 2014: Her low back pain now has imroved with PT, she complains of depression anxiety, cries a lot, she is the main caregiver of her mother, who suffered dementia, She also complains of obsessive compulsive thoughts, mild constipation, worry about the possibility of ovarian cancer She is tearful during today's interview, she was given Zoloft 25 mg daily, but made her feel worse,  UPDATE June 7th 2016: Her mood has much improved since she started taking Effexor now she is taking 37.5 milligrams 2 tablets every morning,she has mild constipation, is taking Linzess, which has been very helpful as well, he denies significant gait difficulty, continue have low back pain, she visit her mother daily   UPDATE 12 /7/16 (MM): Gail Mercado is a 65 year old female with a history of chronic back pain, depression and Parkinson's disease. She returns today for an  evaluation. She continues on Requip XL 8 mg 3 tablets at bedtime. As well as Azilect 1 mg daily. She reports that this has been beneficial. She states that her tremor in the right hand may have gotten slightly worse. She states that it only occurs at rest. Denies any changes with her gait or balance. She states  that due to her back pain occasionally she will have tightness in the right lower back. She denies any falls. She does not use any assistive device when in ambulating. Denies any trouble swallowing or drooling. Denies any trouble with sleeping. The patient states as far as her back pain she has been using a machine that provide slow passive movement to the lower back. She feels that this has been very beneficial for her. She states that she typically takes ibuprofen if she has severe discomfort that is not relieved with stretching. She also sees a Landchiropractor. She states that the ibuprofen will cause acid reflux and she normally has to take Tums with this medication. She states that her friend let her try ibuprofen with famotidine and that was helpful. She returns today for an evaluation.  Today 11/28/2015:  Gail Mercado is a 65 year old female with a history of Parkinson's disease and chronic back pain. She returns today for follow-up. She states that she was on vacation and had to walk a long distance to get up.. She states after that she developed pain in the right hip and right lower back. She states she feels as if the muscles on the right side of the body are tightened. She did increase her tizanidine to every 6 hours but has not been taking it consistently. She has noticed some benefit with tizanidine but her discomfort has not resolved. He said her pain makes the tremor in the right hand worse. She also notes that her anxiety also affects her tremor. She did restart her Azilect. She's been taking Senokot at night and that has been beneficial for constipation. She is also on Effexor for depression and anxiety. Her primary care increased her Effexor did not give her the extended release therefore she remains on Effexor extended release 75 mg daily. She returns today for an evaluation.  REVIEW OF SYSTEMS: Out of a complete 14 system review of symptoms, the patient complains only of the following symptoms,  and all other reviewed systems are negative.  Joint pain, aching muscles, muscle cramps, walking difficulty, stiffness, nervous/anxious, tremors  ALLERGIES: Allergies  Allergen Reactions  . Ciprofloxacin     REACTION: unknown  . Penicillins     REACTION: hives    HOME MEDICATIONS: Outpatient Prescriptions Prior to Visit  Medication Sig Dispense Refill  . aspirin-acetaminophen-caffeine (EXCEDRIN MIGRAINE) 250-250-65 MG per tablet Take 1 tablet by mouth every 6 (six) hours as needed for pain.    . Calcium Carbonate-Vit D-Min (GNP CALCIUM PLUS 600 +D PO) Take 2 tablets by mouth daily.      . Cholecalciferol (VITAMIN D) 2000 UNITS CAPS Take 4,000 Units by mouth daily.     . hydrochlorothiazide (HYDRODIURIL) 25 MG tablet Take 25 mg by mouth daily.    Marland Kitchen. levothyroxine (SYNTHROID, LEVOTHROID) 25 MCG tablet Take 25 mcg by mouth daily.    . mometasone-formoterol (DULERA) 100-5 MCG/ACT AERO Inhale 1 puff into the lungs 2 (two) times daily. 1 Inhaler 11  . Omega-3 Fatty Acids (FISH OIL) 1200 MG CAPS Take 1 capsule by mouth daily.      . rasagiline (AZILECT) 1 MG TABS tablet Take  1 tablet (1 mg total) by mouth daily. 90 tablet 3  . rOPINIRole (REQUIP XL) 8 MG 24 hr tablet Take 3 tablets (24 mg total) by mouth at bedtime. 270 tablet 3  . tiZANidine (ZANAFLEX) 2 MG tablet Take 1 tablet (2 mg total) by mouth every 6 (six) hours as needed for muscle spasms. 30 tablet 0  . venlafaxine XR (EFFEXOR-XR) 75 MG 24 hr capsule Take 1 capsule (75 mg total) by mouth daily with breakfast. (Patient taking differently: Take 150 mg by mouth daily with breakfast. ) 30 capsule 11  . LINZESS 290 MCG CAPS capsule      No facility-administered medications prior to visit.    PAST MEDICAL HISTORY: Past Medical History  Diagnosis Date  . Asthma   . Bronchiectasis   . Hypertension   . Anxiety   . Parkinson disease (HCC)     PAST SURGICAL HISTORY: Past Surgical History  Procedure Laterality Date  . Breast biopsy  1976    . Back surgery in 1996 & 2005    . Toe surgery Left 01/09/2015  . Cataract extraction, bilateral  05/31/2015, 06-21-15    FAMILY HISTORY: Family History  Problem Relation Age of Onset  . Heart disease Father   . Dementia Mother   . High blood pressure Mother   . Osteoarthritis Mother   . Arthritis Mother     SOCIAL HISTORY: Social History   Social History  . Marital Status: Married    Spouse Name: Onalee Hua  . Number of Children: 1  . Years of Education: masters   Occupational History  .  [employer]    part time   Social History Main Topics  . Smoking status: Former Smoker -- 0.30 packs/day for 10 years    Types: Cigarettes    Quit date: 05/12/1978  . Smokeless tobacco: Never Used  . Alcohol Use: 0.0 oz/week     Comment: occ ETOH  . Drug Use: No  . Sexual Activity: Not on file   Other Topics Concern  . Not on file   Social History Narrative   Patient is retired . Patient works part time at [employer]. Patient has a Scientist, water quality.   Caffeine- One cup daily   Right handed.      PHYSICAL EXAM  Filed Vitals:   11/28/15 0754  BP: 147/85  Pulse: 47  Height: 5\' 5"  (1.651 m)  Weight: 177 lb 6.4 oz (80.468 kg)   Body mass index is 29.52 kg/(m^2).  Generalized: Well developed, in no acute distress   Neurological examination  Mentation: Alert oriented to time, place, history taking. Follows all commands speech and language fluent Cranial nerve II-XII: Pupils were equal round reactive to light. Extraocular movements were full, visual field were full on confrontational test. Facial sensation and strength were normal. Uvula tongue midline. Head turning and shoulder shrug  were normal and symmetric. Motor: The motor testing reveals 5 over 5 strength of all 4 extremities. Good symmetric motor tone is noted throughout. Resting tremor noted in the right hand. Sensory: Sensory testing is intact to soft touch on all 4 extremities. No evidence of extinction  is noted.  Coordination: Cerebellar testing reveals good finger-nose-finger and heel-to-shin bilaterally.  Gait and station: Gait is normal. Tandem gait is normal. Romberg is negative. No drift is seen.  Reflexes: Deep tendon reflexes are symmetric and normal bilaterally.   DIAGNOSTIC DATA (LABS, IMAGING, TESTING) - I reviewed patient records, labs, notes, testing and imaging myself where available.  ASSESSMENT AND PLAN 65 y.o. year old female  has a past medical history of Asthma; Bronchiectasis; Hypertension; Anxiety; and Parkinson disease (HCC). here with:  1. Parkinson's disease 2. Chronic back pain  Patient will increase tizanidine to 1-1/2 tablets 3 times a day for back pain. She will continue on Requip and Azilect for Parkinson's disease. Patient asked for Effexor to be increased. I did advise that Effexor and Azilect interact and can potentially cause serotonin syndrome. Patient verbalized understanding. At this time she would rather keep Effexor at the current dose. Patient advised that if her symptoms worsen or she develops any new symptoms she should let us know. Will follow-up in 3 months or sooner if needed.    Butch Penny, MSN, NP-C 11/28/2015, 8:06 AM [facility] [address] [place], [place] [address] [phone]

## 2015-11-29 ENCOUNTER — Encounter: Payer: Self-pay | Admitting: Adult Health

## 2015-11-29 ENCOUNTER — Telehealth: Payer: Self-pay | Admitting: *Deleted

## 2015-11-29 NOTE — Telephone Encounter (Signed)
Per Dolores HooseM MIllikan, NP, spoke with Weston BrassNick, pharmacist at [facility] to give patient 30 day supply with 5 refills. He stated that they have already received an order for a  90 day supply plus one refill from Dr Zachery DauerBarnes. This RN did not give any more refills. Megan made aware.

## 2015-12-10 ENCOUNTER — Telehealth: Payer: Self-pay | Admitting: Adult Health

## 2015-12-10 ENCOUNTER — Other Ambulatory Visit: Payer: Self-pay | Admitting: Neurology

## 2015-12-10 ENCOUNTER — Other Ambulatory Visit: Payer: Self-pay | Admitting: Family Medicine

## 2015-12-10 DIAGNOSIS — M545 Low back pain: Secondary | ICD-10-CM

## 2015-12-10 DIAGNOSIS — R140 Abdominal distension (gaseous): Secondary | ICD-10-CM

## 2015-12-10 DIAGNOSIS — R937 Abnormal findings on diagnostic imaging of other parts of musculoskeletal system: Secondary | ICD-10-CM

## 2015-12-10 NOTE — Telephone Encounter (Signed)
I received a call from the patient's PCP Dr. Luciana Axe. She reports that the patient would like a referral to neurosurgery for her back pain. I am amendable to referring the patient. I have called the patient to confirm that she does want the referral before this is processed. Left message on her cell to call our office at her convenience.

## 2015-12-10 NOTE — Addendum Note (Signed)
Addended by: Enedina Finner on: 12/10/2015 04:50 PM   Modules accepted: Orders

## 2015-12-10 NOTE — Telephone Encounter (Signed)
Pt returned call. I skyped Megan who took call.

## 2015-12-10 NOTE — Telephone Encounter (Signed)
The patient called back. She does confirm that she would like to see a neurosurgeon in regards to her chronic back pain. I will put a referral in.

## 2016-01-02 ENCOUNTER — Other Ambulatory Visit: Payer: Self-pay | Admitting: Neurology

## 2016-02-28 ENCOUNTER — Other Ambulatory Visit: Payer: Self-pay | Admitting: *Deleted

## 2016-02-28 MED ORDER — VENLAFAXINE HCL ER 75 MG PO CP24: 75.0000 mg | ORAL_CAPSULE | Freq: Every day | ORAL | 3 refills | Status: DC

## 2016-03-12 ENCOUNTER — Telehealth: Payer: Self-pay | Admitting: Neurology

## 2016-03-12 NOTE — Telephone Encounter (Signed)
Spoke to pt's husband - states she is having increased tremors, difficulty controlling emotions (often tearful) and some back/leg pain.  They are leaving for the beach tomorrow. Her appt has been moved to 03/27/16.

## 2016-03-12 NOTE — Telephone Encounter (Signed)
Gail Mercado/Dr Venetia MaxonStern [phone] called to advise per Dr Venetia MaxonStern he thinks that something more can be done because her parkinson's has gotten worse. Pt would like to be seen sooner than current appt on 12/6

## 2016-03-18 ENCOUNTER — Telehealth: Payer: Self-pay | Admitting: Neurology

## 2016-03-18 NOTE — Telephone Encounter (Signed)
Pt called in stating the nurse called her yesterday evening about moving an appt for her to come in earlier. Please call and advise

## 2016-03-18 NOTE — Telephone Encounter (Signed)
Patient returned Michelle's call. °

## 2016-03-18 NOTE — Telephone Encounter (Signed)
Left message for a return call

## 2016-03-18 NOTE — Telephone Encounter (Signed)
Spoke to patient - appt has been moved to an earlier date.

## 2016-03-20 ENCOUNTER — Ambulatory Visit (INDEPENDENT_AMBULATORY_CARE_PROVIDER_SITE_OTHER): Payer: Medicare Other | Admitting: Neurology

## 2016-03-20 ENCOUNTER — Encounter: Payer: Self-pay | Admitting: Neurology

## 2016-03-20 VITALS — BP 137/84 | HR 87 | Ht 65.0 in | Wt 175.0 lb

## 2016-03-20 DIAGNOSIS — G20 Parkinson's disease: Secondary | ICD-10-CM

## 2016-03-20 DIAGNOSIS — M5416 Radiculopathy, lumbar region: Secondary | ICD-10-CM | POA: Diagnosis not present

## 2016-03-20 DIAGNOSIS — F419 Anxiety disorder, unspecified: Secondary | ICD-10-CM | POA: Diagnosis not present

## 2016-03-20 MED ORDER — CARBIDOPA-LEVODOPA 25-100 MG PO TABS: 1.0000 | ORAL_TABLET | Freq: Three times a day (TID) | ORAL | 11 refills | Status: DC

## 2016-03-20 NOTE — Progress Notes (Signed)
PATIENT: Gail Mercado DOB: June 19, 1950  REASON FOR VISIT: follow up HISTORY FROM: patient  HISTORY OF PRESENT ILLNESS:  HISTORY Gail Mercado): Gail Mercado is a 65 RH yo WF, referred by her primary care Dr.Elizabeth Zachery Dauer, and orthopedic surgeon Dr. Shon Baton for evaluation of right hand tremor.  She has PMHx of HTN, has gradual onset of right arm clumsiness over past 3 years, initially she attributed it to her fell, but she denies siginificant pain, she has mild right hand tremor, difficult to write on black board, worsening hand writing.  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 She denies gait difficulty  MRI cervical showed mild canal stenosis, and cord deformity at C4-5, C5-6. MRI of the brain showed mild small vessel disease.  Laboratory was normal for TSH, B12 588  On examination, she has mild rigidity, bradykinesia, consistent with idiopathic Parkinson's disease, I have started Requip regular form, she complains of significant side effect, including dizziness, low blood pressure, and also correlate with the time that she has a lot of family related stress,   I have switch her to Requip xr titrating to 2 mg 3 tablets every night since 2014, at beginning, she complains of mild dizziness in the morning, She noticed that she can use her right arm, hand better, hand writing is better, signing name is better, she is hold her arm better, better arm swing,   UPDATE April 29th 2015: She is on thyroid supplement now, feeling better. She is taking Requip xr 6mg  2 tabs qhs, also azilect 1mg  qday, tolerate the medication very well, she also complains of low back pain, radiating pain to her right leg, getting worse after prolonged standing,walking, heavy lifting, she still takes care of her mother,which require lifting.  UPDATE Feb 22 2014: She is now taking reprinted xr 8  mg 2 tablets every night, Azilect 1 mg daily, tolerating the medication well, no significant side effect,  We have reviewed MRI of lumbar 1. At L5-S1: disc bulging, facet hypertrophy, with mild spinal stenosis, severe right and moderate left foraminal stenosis  2. At L4-5: disc bulging and facet hypertrophy with mild spinal stenosis, mild right and moderate left foraminal stenosis  3. At L2-3: disc bulging and facet hypertrophy with mild spinal stenosis and mild biforaminal stenosis  4. At L3-4: disc bulging and facet hypertrophy with mild right and moderate left foraminal stenosis  EMG nerve Conduction study showed no significant abnormalities She has received physical therapy, which has been helpful, also exercise regularly.  UPDATE August 23 2014: Her low back pain now has imroved with PT, she complains of depression anxiety, cries a lot, she is the main caregiver of her mother, who suffered dementia, She also complains of obsessive compulsive thoughts, mild constipation, worry about the possibility of ovarian cancer She is tearful during today's interview, she was given Zoloft 25 mg daily, but made her feel worse,  UPDATE June 7th 2016: Her mood has much improved since she started taking Effexor now she is taking 37.5 milligrams 2 tablets every morning,she has mild constipation, is taking Linzess, which has been very helpful as well, he denies significant gait difficulty, continue have low back pain, she visit her mother daily  Update March 20 2016 She is now taking Requip 8 mg 3 tablets every night, Azilect 1 mg daily,  She had increased right-sided low back pain  radiating pain to right lower extremity, was seen by neurosurgeon Dr. Venetia MaxonStern,  I have reviewed reviewed repeat MRI lumbar in multilevel degenerative disc disease most severe at L4-5, L5-S1, with moderate to severe bilateral foraminal stenosis she is referred for epidural injection  Also taking tramadol as needed for pain,  only Effexor 75 mg which has helped her anxiety,   REVIEW OF SYSTEMS: Out of a complete 14 system review of symptoms, the patient complains only of the following symptoms, and all other reviewed systems are negative.  Joint pain, aching muscles, muscle cramps, walking difficulty, stiffness, nervous/anxious, tremors  ALLERGIES: Allergies  Allergen Reactions  . Ciprofloxacin     REACTION: unknown  . Penicillins     REACTION: hives    HOME MEDICATIONS: Outpatient Medications Prior to Visit  Medication Sig Dispense Refill  . aspirin-acetaminophen-caffeine (EXCEDRIN MIGRAINE) 250-250-65 MG per tablet Take 1 tablet by mouth every 6 (six) hours as needed for pain.    . hydrochlorothiazide (HYDRODIURIL) 25 MG tablet Take 25 mg by mouth daily.    Marland Kitchen. levothyroxine (SYNTHROID, LEVOTHROID) 25 MCG tablet Take 25 mcg by mouth daily.    . mometasone-formoterol (DULERA) 100-5 MCG/ACT AERO Inhale 1 puff into the lungs 2 (two) times daily. 1 Inhaler 11  . rasagiline (AZILECT) 1 MG TABS tablet Take 1 tablet (1 mg total) by mouth daily. 90 tablet 3  . rOPINIRole (REQUIP XL) 8 MG 24 hr tablet Take 3 tablets (24 mg total) by mouth at bedtime. 270 tablet 3  . senna (SENOKOT) 8.6 MG tablet Take 4 tablets by mouth daily.    Marland Kitchen. tiZANidine (ZANAFLEX) 2 MG tablet Take 1.5 tablets (3 mg total) by mouth 3 (three) times daily. 30 tablet 0  . venlafaxine XR (EFFEXOR-XR) 75 MG 24 hr capsule Take 1 capsule (75 mg total) by mouth daily with breakfast. 90 capsule 3  . Calcium Carbonate-Vit D-Min (GNP CALCIUM PLUS 600 +D PO) Take 2 tablets by mouth daily.      . Cholecalciferol (VITAMIN D) 2000 UNITS CAPS Take 4,000 Units by mouth daily.     . Omega-3 Fatty Acids (FISH OIL) 1200 MG CAPS Take 1 capsule by mouth daily.       No facility-administered medications prior to visit.     PAST MEDICAL HISTORY: Past Medical History:  Diagnosis Date  . Anxiety   . Asthma   . Back pain   . Bronchiectasis   . Hypertension   .  Parkinson disease (HCC)     PAST SURGICAL HISTORY: Past Surgical History:  Procedure Laterality Date  . back surgery in 1996 & 2005    . breast biopsy 1976    . CATARACT EXTRACTION, BILATERAL  05/31/2015, 06-21-15  . TOE SURGERY Left 01/09/2015    FAMILY HISTORY: Family History  Problem Relation Age of Onset  . Dementia Mother   . High blood pressure Mother   . Osteoarthritis Mother   . Arthritis Mother   . Heart disease Father     SOCIAL HISTORY: Social History   Social History  . Marital status: Married    Spouse name: Onalee HuaDavid  . Number of children: 1  . Years of education: masters   Occupational History  .  [employer]    part time   Social History Main Topics  . Smoking status: Former Smoker    Packs/day: 0.30    Years: 10.00    Types: Cigarettes    Quit date: 05/12/1978  . Smokeless tobacco: Never Used  .  Alcohol use 0.0 oz/week     Comment: occ ETOH  . Drug use: No  . Sexual activity: Not on file   Other Topics Concern  . Not on file   Social History Narrative   Patient is retired . Patient works part time at [employer]. Patient has a Scientist, water qualitymasters.   Caffeine- One cup daily   Right handed.      PHYSICAL EXAM  Vitals:   03/20/16 1313  BP: 137/84  Pulse: 87  Weight: 175 lb (79.4 kg)  Height: 5\' 5"  (1.651 m)   Body mass index is 29.12 kg/m.   PHYSICAL EXAMNIATION:  Gen: NAD, conversant, well nourised, obese, well groomed                     Cardiovascular: Regular rate rhythm, no peripheral edema, warm, nontender. Eyes: Conjunctivae clear without exudates or hemorrhage Neck: Supple, no carotid bruits. Pulmonary: Clear to auscultation bilaterally   NEUROLOGICAL EXAM:  MENTAL STATUS: Speech:    Speech is normal; fluent and spontaneous with normal comprehension.  Cognition:     Orientation to time, place and person     Normal recent and remote memory     Normal Attention span and concentration     Normal Language, naming,  repeating,spontaneous speech     Fund of knowledge   CRANIAL NERVES: CN II: Visual fields are full to confrontation. Fundoscopic exam is normal with sharp discs and no vascular changes. Pupils are round equal and briskly reactive to light. CN III, IV, VI: extraocular movement are normal. No ptosis. CN V: Facial sensation is intact to pinprick in all 3 divisions bilaterally. Corneal responses are intact.  CN VII: Face is symmetric with normal eye closure and smile. CN VIII: Hearing is normal to rubbing fingers CN IX, X: Palate elevates symmetrically. Phonation is normal. CN XI: Head turning and shoulder shrug are intact CN XII: Tongue is midline with normal movements and no atrophy.  MOTOR: Right arm resting tremor, significant rigidity, increased with reinforcement maneuver, she also has moderate rigidity in her local, legs  REFLEXES: Reflexes are 2+ and symmetric at the biceps, triceps, knees, and ankles. Plantar responses are flexor.  SENSORY: Intact to light touch, pinprick, positional and vibratory sensation are intact in fingers and toes.  COORDINATION: Rapid alternating movements and fine finger movements are intact. There is no dysmetria on finger-to-nose and heel-knee-shin.    GAIT/STANCE: Decreased right arm swing, moderate stride, smooth turning    DIAGNOSTIC DATA (LABS, IMAGING, TESTING) - I reviewed patient records, labs, notes, testing and imaging myself where available.   ASSESSMENT AND PLAN 65 y.o. year old female   Idiopathic Parkinson's disease Continue Requip XL 8 mg 3 tablets every night Stop Azilect  add on Sinemet 25/100 one tablet 3 times a day  Right lumbar radiculopathy Epidural injection by pain management  Levert FeinsteinYijun Azaela Caracci, M.D. Ph.D.  [facility] [address] [place], [place] [address] Phone: [phone] Fax:      [phone]

## 2016-03-27 ENCOUNTER — Ambulatory Visit: Payer: Self-pay | Admitting: Neurology

## 2016-03-29 ENCOUNTER — Ambulatory Visit
Admission: EM | Admit: 2016-03-29 | Discharge: 2016-03-29 | Disposition: A | Discharge status: Home / Self Care | Payer: Medicare Other | Attending: Family Medicine | Admitting: Family Medicine

## 2016-03-29 DIAGNOSIS — T7840XA Allergy, unspecified, initial encounter: Secondary | ICD-10-CM | POA: Diagnosis not present

## 2016-03-29 DIAGNOSIS — M7989 Other specified soft tissue disorders: Secondary | ICD-10-CM

## 2016-03-29 DIAGNOSIS — W57XXXA Bitten or stung by nonvenomous insect and other nonvenomous arthropods, initial encounter: Secondary | ICD-10-CM | POA: Diagnosis not present

## 2016-03-29 MED ORDER — CETIRIZINE HCL 10 MG PO TABS: 10.0000 mg | ORAL_TABLET | Freq: Every day | ORAL | 0 refills | Status: DC

## 2016-03-29 MED ORDER — MUPIROCIN 2 % EX OINT: 1.0000 "application " | TOPICAL_OINTMENT | Freq: Three times a day (TID) | CUTANEOUS | 0 refills | Status: DC

## 2016-03-29 MED ORDER — RANITIDINE HCL 150 MG PO CAPS: ORAL_CAPSULE | ORAL | 0 refills | Status: DC

## 2016-03-29 MED ORDER — LORATADINE 10 MG PO TABS: 10.0000 mg | ORAL_TABLET | Freq: Every day | ORAL | 0 refills | Status: DC

## 2016-03-29 NOTE — ED Notes (Signed)
RN uses ring cutter to remove wedding ring from pt's left hand 4th finger. Ring removed without incident. Ice applied. Swelling reduces dramatically.

## 2016-03-29 NOTE — ED Provider Notes (Addendum)
[facility]    CSN: [id number] Arrival date & time: 03/29/16  1522     History   Chief Complaint Chief Complaint  Patient presents with  . Insect Bite    HPI Johnanna SchneidersRita A Krigbaum is a 65 y.o. female.   Patient's here because of insect bite on the fourth ring finger left hand. She was bitten by some insect stung by some insect earlier. She's not had swelling of the fourth ring finger and the remaining is cutting into her finger as well. She reports that telling Benadryl. She has history of asthma back pain hypertension and Parkinson's disease. She's had back surgery twice previous breast biopsy cataract extraction toe surgery. She is a former smoker. No stiff can past family medical history pertinent to today's visit   The history is provided by the patient and a relative. No language interpreter was used.  Allergic Reaction  Presenting symptoms: swelling   Presenting symptoms: no difficulty breathing, no difficulty swallowing, no itching, no rash and no wheezing   Severity:  Moderate Prior allergic episodes:  No prior episodes Context: insect bite/sting   Relieved by:  Nothing Worsened by:  Nothing Ineffective treatments:  None tried   Past Medical History:  Diagnosis Date  . Anxiety   . Asthma   . Back pain   . Bronchiectasis   . Hypertension   . Parkinson disease [facility](HCC)     Patient Active Problem List   Diagnosis Date Noted  . Low back pain 09/07/2013  . Right lumbar radiculopathy 09/07/2013  . Hypertension   . Anxiety   . Parkinson disease (HCC)   . Unspecified essential hypertension 06/30/2012  . Parkinson's disease (tremor, stiffness, slow motion, unstable posture) (HCC) 06/30/2012  . PULMONARY NODULE 04/24/2010  . ASTHMA 11/20/2009  . BRONCHIECTASIS W/O ACUTE EXACERBATION 11/20/2009  . DEPRESSION/ANXIETY 11/19/2009    Past Surgical History:  Procedure Laterality Date  . back surgery in 1996 & 2005    . breast biopsy 1976    . CATARACT  EXTRACTION, BILATERAL  05/31/2015, 06-21-15  . TOE SURGERY Left 01/09/2015    OB History    No data available       Home Medications    Prior to Admission medications   Medication Sig Start Date End Date Taking? Authorizing Provider  aspirin-acetaminophen-caffeine (EXCEDRIN MIGRAINE) [phone] MG per tablet Take 1 tablet by mouth every 6 (six) hours as needed for pain.    Historical Provider, MD  Calcium Citrate-Vitamin D (CALCIUM + D PO) Take 1 tablet by mouth daily.    Historical Provider, MD  carbidopa-levodopa (SINEMET IR) 25-100 MG tablet Take 1 tablet by mouth 3 (three) times daily. 03/20/16   Levert FeinsteinYijun Yan, MD  cetirizine (ZYRTEC) 10 MG tablet Take 1 tablet (10 mg total) by mouth daily. If needed at night for itching not relieved by Claritin in the morning. 03/29/16   Hassan RowanEugene Isis Costanza, MD  hydrochlorothiazide (HYDRODIURIL) 25 MG tablet Take 25 mg by mouth daily.    Historical Provider, MD  levothyroxine (SYNTHROID, LEVOTHROID) 25 MCG tablet Take 25 mcg by mouth daily. 01/21/13   Historical Provider, MD  loratadine (CLARITIN) 10 MG tablet Take 1 tablet (10 mg total) by mouth daily. Take 1 tablet in the morning. As needed for itching. And swelling 03/29/16   Hassan RowanEugene Parsa Rickett, MD  mometasone-formoterol (DULERA) 100-5 MCG/ACT AERO Inhale 1 puff into the lungs 2 (two) times daily. 04/30/11   Nyoka CowdenMichael B Wert, MD  mupirocin ointment (BACTROBAN) 2 % Apply 1  application topically 3 (three) times daily. 03/29/16   Hassan Rowan, MD  ranitidine (ZANTAC) 150 MG capsule 1 capsule twice a day when necessary for itching and swelling 03/29/16   Hassan Rowan, MD  rOPINIRole (REQUIP XL) 8 MG 24 hr tablet Take 3 tablets (24 mg total) by mouth at bedtime. 10/17/15   Levert Feinstein, MD  senna (SENOKOT) 8.6 MG tablet Take 4 tablets by mouth daily.    Historical Provider, MD  tiZANidine (ZANAFLEX) 2 MG tablet Take 1.5 tablets (3 mg total) by mouth 3 (three) times daily. 11/28/15   Butch Penny, NP  traMADol (ULTRAM) 50 MG tablet  Take 100 mg by mouth every 12 (twelve) hours as needed.     Historical Provider, MD  venlafaxine XR (EFFEXOR-XR) 75 MG 24 hr capsule Take 1 capsule (75 mg total) by mouth daily with breakfast. 02/28/16   Levert Feinstein, MD    Family History Family History  Problem Relation Age of Onset  . Dementia Mother   . High blood pressure Mother   . Osteoarthritis Mother   . Arthritis Mother   . Heart disease Father     Social History Social History  Substance Use Topics  . Smoking status: Former Smoker    Packs/day: 0.30    Years: 10.00    Types: Cigarettes    Quit date: 05/12/1978  . Smokeless tobacco: Never Used  . Alcohol use 0.0 oz/week     Comment: occ ETOH     Allergies   Ciprofloxacin; Penicillins; and Wasp venom   Review of Systems Review of Systems  HENT: Negative for rhinorrhea and trouble swallowing.   Respiratory: Negative for apnea, choking, chest tightness and wheezing.   Cardiovascular: Negative for chest pain.  Musculoskeletal: Positive for joint swelling and myalgias.  Skin: Negative for itching and rash.  All other systems reviewed and are negative.    Physical Exam Triage Vital Signs ED Triage Vitals  Enc Vitals Group     BP 03/29/16 1544 126/60     Pulse Rate 03/29/16 1544 86     Resp 03/29/16 1544 18     Temp 03/29/16 1544 97.9 F (36.6 C)     Temp Source 03/29/16 1544 Oral     SpO2 03/29/16 1544 98 %     Weight 03/29/16 1545 170 lb (77.1 kg)     Height 03/29/16 1545 5\' 5"  (1.651 m)     Head Circumference --      Peak Flow --      Pain Score 03/29/16 1548 0     Pain Loc --      Pain Edu? --      Excl. in GC? --    No data found.   Updated Vital Signs BP 126/60 (BP Location: Left Arm)   Pulse 86   Temp 97.9 F (36.6 C) (Oral)   Resp 18   Ht 5\' 5"  (1.651 m)   Wt 170 lb (77.1 kg)   SpO2 98%   BMI 28.29 kg/m   Visual Acuity Right Eye Distance:   Left Eye Distance:   Bilateral Distance:    Right Eye Near:   Left Eye Near:      Bilateral Near:     Physical Exam  Constitutional: She is oriented to person, place, and time. She appears well-developed and well-nourished.  HENT:  Head: Normocephalic.  Eyes: Pupils are equal, round, and reactive to light.  Neck: Normal range of motion.  Cardiovascular: Normal rate and regular rhythm.  Pulmonary/Chest: Effort normal.  Musculoskeletal: Normal range of motion. She exhibits tenderness.  Neurological: She is alert and oriented to person, place, and time.  Skin: Skin is warm.  Psychiatric: She has a normal mood and affect.     UC Treatments / Results  Labs (all labs ordered are listed, but only abnormal results are displayed) Labs Reviewed - No data to display  EKG  EKG Interpretation None       Radiology No results found.  Procedures .Foreign Body Removal Date/Time: 03/29/2016 4:08 PM Performed by: Ferdie PingWIEDENHEFT, BETSY LYNN Authorized by: Hassan RowanWADE, Lorissa Kishbaugh  Consent: Verbal consent obtained. Intake: Left ring finger.  Sedation: Patient sedated: no Patient restrained: no Complexity: simple Objects recovered: Wedding ring Post-procedure assessment: foreign body removed Comments: Patient left ring finger was marked swollen. Nurse under my direction oriented to remove the ring since I was tied up in another room. She was able to bring off of them I got into the room with patient markedly improved   (including critical care time)  Medications Ordered in UC Medications - No data to display   Initial Impression / Assessment and Plan / UC Course  I have reviewed the triage vital signs and the nursing notes.  Pertinent labs & imaging results that were available during my care of the patient were reviewed by me and considered in my medical decision making (see chart for details).  Clinical Course     Patient stooling fine no shortness of breath. We will place on Zyrtec 10 mg 1 tablet at night if needed. Since she does not tolerate Benadryl well we'll  place on Claritin 10 mg 1 tablet the morning and Zyrtec at night if needed. Also recommend taking Zantac twice a day to help prevent and reduce swelling and Ativan ointment 3 times a day. Explained to her that the Claritin and Zyrtec and Zantac or optional but she should be taking the Bactrim for least 5-7 days to 3 times a day on the finger to prevent infection follow-up with PCP in a week if needed.  Final Clinical Impressions(s) / UC Diagnoses   Final diagnoses:  Insect bite, initial encounter  Allergic reaction, initial encounter    New Prescriptions New Prescriptions   CETIRIZINE (ZYRTEC) 10 MG TABLET    Take 1 tablet (10 mg total) by mouth daily. If needed at night for itching not relieved by Claritin in the morning.   LORATADINE (CLARITIN) 10 MG TABLET    Take 1 tablet (10 mg total) by mouth daily. Take 1 tablet in the morning. As needed for itching. And swelling   MUPIROCIN OINTMENT (BACTROBAN) 2 %    Apply 1 application topically 3 (three) times daily.   RANITIDINE (ZANTAC) 150 MG CAPSULE    1 capsule twice a day when necessary for itching and swelling     Hassan RowanEugene Anaelle Dunton, MD 03/29/16 1614    Hassan RowanEugene Anahid Eskelson, MD 03/29/16 1616

## 2016-03-29 NOTE — ED Triage Notes (Signed)
Pt was stung by a wasp on left hand 4th finger. Swelling around wedding ring. No coughing, wheezing, SOB or throat swelling. Local reaction noted with redness and swelling. Denies pain.

## 2016-04-01 ENCOUNTER — Encounter: Payer: Self-pay | Admitting: Neurology

## 2016-04-02 ENCOUNTER — Encounter: Payer: Self-pay | Admitting: Neurology

## 2016-04-16 ENCOUNTER — Ambulatory Visit: Payer: BC Managed Care – PPO | Admitting: Neurology

## 2016-05-29 NOTE — Progress Notes (Signed)
Gail Mercado was seen today in the movement disorders clinic for neurologic consultation at the request of Dr. Vertell Mercado.  Pts PCP is Gail Heck, MD.  The consultation is for the evaluation of Parkinsons disease.  The records that were made available to me were reviewed.  First records I have from Dr. Krista Mercado are from 08/2012 but it is clear she was seen prior to that (pt states that she was seen since about 2012).  Pt states that first sx was R hand tremor and R hand clumsiness and that was in approximately 2011.  States that it seemed to start after a fall over a dishwasher but ortho didn't seem to think that it was all related to the shoulder.  She was started on requip (pt thinks in 2013) and changed to requip XL due to dizziness.   Over time this dosage has increased to 24 mg daily (been on this dose since 03/2014).  She was started on azilect in 2014 (pt states that she d/c this due to "interactions" with other meds).  Pt began to note inner anxiety and tried effexor without relief.  She was also noting severe tightness in the right side of the back.  carbidopa/levodopa 25/100 tid was added in 03/2016.  Pt states that within taking 2 pills she felt that "it was a miracle."  Her inner anxiety, writing, tremor, and back pain were all better.  She now takes carbidopa/levodopa 25/100 at 6am/1pm.    Specific Symptoms:  Tremor: Yes.  , only R hand but better now that on levodopa Family hx of similar:  No. Voice: no change Sleep: sleeping well  Vivid Dreams:  Yes.    Acting out dreams:  Yes.  , better now Wet Pillows: No. Postural symptoms:  Yes.  , better now that on carbidopa/levodopa  Falls?  Yes.  , last 6 months ago in the yard (fell in slick mud) Bradykinesia symptoms: was having trouble getting out of low car but better on meds Loss of smell:  No. Loss of taste:  No. Urinary Incontinence:  Yes.   (stress only, wearing pad) Difficulty Swallowing:  No. Handwriting, micrographia: Yes.    but carbidopa/levodopa 25/100 improved it Trouble with ADL's:  No.  Trouble buttoning clothing: No. Depression:  No. (cutting back on effexor because once started levodopa she found that helped inner anxiety.  Only taking 2 pills per week) Memory changes:  No. Hallucinations:  No.  visual distortions: No. N/V:  No. Lightheaded:  No.  Syncope: No. Diplopia:  No. Dyskinesia:  No but states that foot taps a little but can stop it and she isn't sure that is dyskinesia  Neuroimaging has previously been performed per pt but nothing is in system except MRI Lumbar spine (patient states that she had MRI brain).   PREVIOUS MEDICATIONS: Requip and carbidopa/levodopa  ALLERGIES:   Allergies  Allergen Reactions  . Ciprofloxacin     REACTION: unknown  . Penicillins     REACTION: hives  . Wasp Venom Swelling    CURRENT MEDICATIONS:  Outpatient Encounter Prescriptions as of 06/03/2016  Medication Sig  . aspirin-acetaminophen-caffeine (EXCEDRIN MIGRAINE) 283-151-76 MG per tablet Take 1 tablet by mouth every 6 (six) hours as needed for pain.  . Calcium Citrate-Vitamin D (CALCIUM + D PO) Take 1 tablet by mouth daily.  . carbidopa-levodopa (SINEMET IR) 25-100 MG tablet Take 1 tablet by mouth 3 (three) times daily.  . hydrochlorothiazide (HYDRODIURIL) 25 MG tablet Take 25 mg by  mouth daily.  Marland Kitchen levothyroxine (SYNTHROID, LEVOTHROID) 25 MCG tablet Take 25 mcg by mouth daily.  . mometasone-formoterol (DULERA) 100-5 MCG/ACT AERO Inhale 1 puff into the lungs 2 (two) times daily.  . Probiotic Product (PROBIOTIC-10 PO) Take by mouth.  Marland Kitchen rOPINIRole (REQUIP XL) 8 MG 24 hr tablet Take 3 tablets (24 mg total) by mouth at bedtime.  Marland Kitchen venlafaxine XR (EFFEXOR-XR) 75 MG 24 hr capsule Take 1 capsule (75 mg total) by mouth daily with breakfast.  . [DISCONTINUED] cetirizine (ZYRTEC) 10 MG tablet Take 1 tablet (10 mg total) by mouth daily. If needed at night for itching not relieved by Claritin in the morning.  .  [DISCONTINUED] loratadine (CLARITIN) 10 MG tablet Take 1 tablet (10 mg total) by mouth daily. Take 1 tablet in the morning. As needed for itching. And swelling  . [DISCONTINUED] mupirocin ointment (BACTROBAN) 2 % Apply 1 application topically 3 (three) times daily.  . [DISCONTINUED] ranitidine (ZANTAC) 150 MG capsule 1 capsule twice a day when necessary for itching and swelling  . [DISCONTINUED] senna (SENOKOT) 8.6 MG tablet Take 4 tablets by mouth daily.  . [DISCONTINUED] tiZANidine (ZANAFLEX) 2 MG tablet Take 1.5 tablets (3 mg total) by mouth 3 (three) times daily. (Patient not taking: Reported on 06/03/2016)  . [DISCONTINUED] traMADol (ULTRAM) 50 MG tablet Take 100 mg by mouth every 12 (twelve) hours as needed.    No facility-administered encounter medications on file as of 06/03/2016.     PAST MEDICAL HISTORY:   Past Medical History:  Diagnosis Date  . Anxiety   . Asthma   . Back pain   . Bronchiectasis   . Hypertension   . Parkinson disease ([place])     PAST SURGICAL HISTORY:   Past Surgical History:  Procedure Laterality Date  . back surgery in 1996 & 2005    . breast biopsy 1976    . CATARACT EXTRACTION, BILATERAL  05/31/2015, 06-21-15  . TOE SURGERY Left 01/09/2015    SOCIAL HISTORY:   Social History   Social History  . Marital status: Married    Spouse name: Shanon Brow  . Number of children: 1  . Years of education: masters   Occupational History  . 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 Smoking status: Former Smoker    Packs/day: 0.30    Years: 10.00    Types: Cigarettes    Quit date: 05/12/1978  . Smokeless tobacco: Never Used  . Alcohol use 0.0 oz/week     Comment: 1-2 glasses of wine a week  . Drug use: No  . Sexual activity: Not on file   Other Topics Concern  . Not on file   Social History Narrative   Patient is retired . Patient works part time at [employer]. Patient has a Oceanographer.   Caffeine- One cup daily   Right  handed.    FAMILY HISTORY:   Family Status  Relation Status  . Mother Alive  . Father Deceased  . Brother Alive  . Son Alive    ROS:  A complete 10 system review of systems was obtained and was unremarkable apart from what is mentioned above.  PHYSICAL EXAMINATION:    VITALS:   Vitals:   06/03/16 0945  BP: 124/86  Pulse: 82  Weight: 171 lb (77.6 kg)  Height: 5' 4.5" (1.638 m)    GEN:  The patient appears stated age and is in NAD. HEENT:  Normocephalic, atraumatic.  The mucous membranes are moist. The superficial temporal arteries are without ropiness or tenderness. CV:  RRR Lungs:  CTAB Neck/HEME:  There are no carotid bruits bilaterally.  Neurological examination:  Orientation: The patient is alert and oriented x3. Fund of knowledge is appropriate.  Recent and remote memory are intact.  Attention and concentration are normal.    Able to name objects and repeat phrases. Cranial nerves: There is good facial symmetry. No significant facial hypomimia.  Pupils are slightly misshapen but reactive. Fundoscopic exam is attempted but the disc margins are not well visualized bilaterally. Extraocular muscles are intact. The visual fields are full to confrontational testing. The speech is fluent and clear. Soft palate rises symmetrically and there is no tongue deviation. Hearing is intact to conversational tone. Sensation: Sensation is intact to light and pinprick throughout (facial, trunk, extremities). Vibration is slightly decreased at the bilateral big toe. There is no extinction with double simultaneous stimulation. There is no sensory dermatomal level identified. Motor: Strength is 5/5 in the bilateral upper and lower extremities.   Shoulder shrug is equal and symmetric.  There is no pronator drift. Deep tendon reflexes: Deep tendon reflexes are 2/4 at the bilateral biceps, triceps, brachioradialis, 3/4 at the bilateral patella and 1/4 at the bilateral achilles. Plantar responses are  downgoing bilaterally.  Movement examination: Tone: There is minimal increased tone in the RUE.  Tone elsewhere is normal Abnormal movements: Tremor can be felt with distraction in the RUE.  She does have mild dyskinesia Coordination:  There is no decremation with RAM's, with any form of RAMS, including alternating supination and pronation of the forearm, hand opening and closing, finger taps, heel taps and toe taps. Gait and Station: The patient has no difficulty arising out of a deep-seated chair without the use of the hands. The patient's stride length is good with slightly decreased arm swing on the right.  The patient has a negative pull test.      ASSESSMENT/PLAN:  1.  Idiopathic Parkinson's disease.  The patient has tremor, bradykinesia, rigidity and mild postural instability.  Dx in 2012 per pt.  -We discussed the diagnosis as well as pathophysiology of the disease.  We discussed treatment options as well as prognostic indicators.  Patient education was provided.  -Greater than 50% of the 60 minute visit was spent in counseling answering questions and talking about what to expect now as well as in the future.  We talked about medication options as well as potential future surgical options.  We talked about safety in the home.  -continue carbidopa/levodopa 25/100 tid  -currently in therapy with Dr. Vertell Mercado.  In future, may transition her to neurorehab PD program for screening  -We discussed community resources in the area including patient support groups and community exercise programs for PD and pt education was provided to the patient.  -Pt met with our social worker today  -hold meds next visit so that I can see what she looks like off of medication  -pt to get me her MRI brain  2.  Parkinsons dyskinesia  -overall mild.  Pt doesn't notice it so won't treat now  3.  Follow up is anticipated in the next few months, sooner should new neurologic issues arise.    Cc:  Gail Heck, MD

## 2016-06-03 ENCOUNTER — Encounter: Payer: Self-pay | Admitting: Neurology

## 2016-06-03 ENCOUNTER — Ambulatory Visit (INDEPENDENT_AMBULATORY_CARE_PROVIDER_SITE_OTHER): Payer: Medicare Other | Admitting: Neurology

## 2016-06-03 VITALS — BP 124/86 | HR 82 | Ht 64.5 in | Wt 171.0 lb

## 2016-06-03 DIAGNOSIS — G249 Dystonia, unspecified: Secondary | ICD-10-CM | POA: Diagnosis not present

## 2016-06-03 DIAGNOSIS — G20 Parkinson's disease: Secondary | ICD-10-CM | POA: Diagnosis not present

## 2016-06-03 NOTE — Patient Instructions (Signed)
We will see you in 3 months.  Make an afternoon appt, and stay off Parkinson's medications for 24 hours prior.

## 2016-06-03 NOTE — Progress Notes (Signed)
Clinical Social Work Note  CSW received request from Dr. Carles Collet to meet with pt during today's initial consultation visit. CSW met with pt in exam room. CSW introduced self and informed of social work role including connection to [employer], Scientist, clinical (histocompatibility and immunogenetics), short-term coping/problem-solving counseling, and involvement in PD community. Pt shared that she lives at her home in [place] with her husband. CSW provided supportive listening as pt discussed that she was diagnosed with Parkinson's disease in 2012. Pt shared that she has seen positive impacts being on carbidopa/levodopa. Emotional support provided as pt discussed that prior to starting the medication she began withdrawing herself from social settings because she was self conscious about her tremor, but now feels more comfortable in social settings because her symptoms are well controlled on medication. CSW inquired with pt about current exercise routine. Pt reports that before the holidays that she was doing chair yoga at [employer], but with the holidays and respiratory illness she has not been. Pt shared that she plans to return to chair yoga this week. CSW discussed local PD exercise programs and clarified pt questions. CSW provided education about local support group and encouraged pt to attend. Pt shared that she is interested in the [facility] cycling program and feels that the [facility] cycling program will be a good addition to chair yoga. Pt also expressed interest in [employer] classes and would like to be updated with the next session begin. CSW discussed benefits of exercise and support group and pt shared that she does feel that it would be helpful to meet other individuals with Parkinson's disease. CSW provided pt with new patient packet that included educational material, resources, and CSW contact information. Pt appreciative of CSW support and discussed that she is looking forward to receiving care from an interdisciplinary team. Pt  agreeable to contact CSW if any social work needs arise.    Social Work Treatment Plan:  1. CSW to update pt on dates of next [employer] classes.  2. CSW to continue to be available to provide psychosocial support to pt as needed  3. Artrice Kraker has been informed of social work services including connection to [employer] and short-term coping/problem-solving counseling and has agreed to contact me if I can be of further assistance with any other social work services.  4. The information contained in this note has been reported to, and discussed with, Dr. Carles Collet.     Alison Murray, MSW, LCSW Clinical Social Worker Movement [facility] [phone]

## 2016-06-11 ENCOUNTER — Telehealth: Payer: Self-pay | Admitting: *Deleted

## 2016-06-11 NOTE — Telephone Encounter (Signed)
Pt CD @ the front desk for pick up.

## 2016-06-19 ENCOUNTER — Telehealth: Payer: Self-pay | Admitting: Clinical

## 2016-06-19 NOTE — Telephone Encounter (Signed)
Attempted to reach pt via telephone, left message on voicemail to notify pt that CSW e-mailed pt information on upcoming [employer] classes. CSW provided CSW contact information if pt had any further questions about information on [employer] Classes.   Loletta SpecterSuzanna Kidd, MSW, LCSW Clinical Social Worker [facility] [phone]

## 2016-09-03 NOTE — Progress Notes (Signed)
Gail Mercado was seen today in the movement disorders clinic for neurologic consultation at the request of Dr. Venetia Maxon.  Pts PCP is Gaye Alken, MD.  The consultation is for the evaluation of Parkinsons disease.  The records that were made available to me were reviewed.  First records I have from Dr. [name] are from 08/2012 but it is clear she was seen prior to that (pt states that she was seen since about 2012).  Pt states that first sx was R hand tremor and R hand clumsiness and that was in approximately 2011.  States that it seemed to start after a fall over a dishwasher but ortho didn't seem to think that it was all related to the shoulder.  She was started on requip (pt thinks in 2013) and changed to requip XL due to dizziness.   Over time this dosage has increased to 24 mg daily (been on this dose since 03/2014).  She was started on azilect in 2014 (pt states that she d/c this due to "interactions" with other meds).  Pt began to note inner anxiety and tried effexor without relief.  She was also noting severe tightness in the right side of the back.  carbidopa/levodopa 25/100 tid was added in 03/2016.  Pt states that within taking 2 pills she felt that "it was a miracle."  Her inner anxiety, writing, tremor, and back pain were all better.  She now takes carbidopa/levodopa 25/100 at 6am/1pm.    Specific Symptoms:  Tremor: Yes.  , only R hand but better now that on levodopa Family hx of similar:  No. Voice: no change Sleep: sleeping well  Vivid Dreams:  Yes.    Acting out dreams:  Yes.  , better now Wet Pillows: No. Postural symptoms:  Yes.  , better now that on carbidopa/levodopa  Falls?  Yes.  , last 6 months ago in the yard (fell in slick mud) Bradykinesia symptoms: was having trouble getting out of low car but better on meds Loss of smell:  No. Loss of taste:  No. Urinary Incontinence:  Yes.   (stress only, wearing pad) Difficulty Swallowing:  No. Handwriting, micrographia: Yes.    but carbidopa/levodopa 25/100 improved it Trouble with ADL's:  No.  Trouble buttoning clothing: No. Depression:  No. (cutting back on effexor because once started levodopa she found that helped inner anxiety.  Only taking 2 pills per week) Memory changes:  No. Hallucinations:  No.  visual distortions: No. N/V:  No. Lightheaded:  No.  Syncope: No. Diplopia:  No. Dyskinesia:  No but states that foot taps a little but can stop it and she isn't sure that is dyskinesia  09/04/16 update:  Patient seen today in follow-up.  This patient is accompanied in the office by her spouse who supplements the history.  She is on Requip XL, 8 mg, 3 tablets per day.  She is also on carbidopa/levodopa 25/100, one tablet 3 times per day.  She denies any compulsive behaviors.  She denies sleep attacks.  She denies lightheadedness or near syncope.  No hallucinations.  No illusions or delusions.  I asked her to hold her medications before today's visit since I have never seen her off her medications.  She last took carbidopa/levodopa 25/100 yesterday at 1pm and requip XL yesterday night.  Pt feels stiff and she "aches" and feels tight.  She has started doing the biking at the [employer].    I also asked her to bring me her MRI  brain and she did that and I reviewed this.  It was dated 04/15/2012.  There was a rare T2 hyperintensity, but was otherwise normal.    PREVIOUS MEDICATIONS: Requip and carbidopa/levodopa  ALLERGIES:   Allergies  Allergen Reactions  . Ciprofloxacin     REACTION: unknown  . Penicillins     REACTION: hives  . Wasp Venom Swelling    CURRENT MEDICATIONS:  Outpatient Encounter Prescriptions as of 09/04/2016  Medication Sig  . aspirin-acetaminophen-caffeine (EXCEDRIN MIGRAINE) 250-250-65 MG per tablet Take 1 tablet by mouth every 6 (six) hours as needed for pain.  . Calcium Citrate-Vitamin D (CALCIUM + D PO) Take 1 tablet by mouth daily.  . carbidopa-levodopa (SINEMET IR) 25-100 MG tablet  Take 1 tablet by mouth 3 (three) times daily.  . hydrochlorothiazide (HYDRODIURIL) 25 MG tablet Take 25 mg by mouth daily.  Marland Kitchen levothyroxine (SYNTHROID, LEVOTHROID) 25 MCG tablet Take 25 mcg by mouth daily.  . Magnesium 100 MG CAPS Take by mouth.  . mometasone-formoterol (DULERA) 100-5 MCG/ACT AERO Inhale 1 puff into the lungs 2 (two) times daily.  . Probiotic Product (PROBIOTIC-10 PO) Take by mouth.  Marland Kitchen rOPINIRole (REQUIP XL) 8 MG 24 hr tablet Take 3 tablets (24 mg total) by mouth at bedtime.  Marland Kitchen venlafaxine XR (EFFEXOR-XR) 75 MG 24 hr capsule Take 1 capsule (75 mg total) by mouth daily with breakfast.   No facility-administered encounter medications on file as of 09/04/2016.     PAST MEDICAL HISTORY:   Past Medical History:  Diagnosis Date  . Anxiety   . Asthma   . Back pain   . Bronchiectasis   . Hypertension   . Parkinson disease (HCC)     PAST SURGICAL HISTORY:   Past Surgical History:  Procedure Laterality Date  . back surgery in 1996 & 2005    . breast biopsy 1976    . CATARACT EXTRACTION, BILATERAL  05/31/2015, 06-21-15  . TOE SURGERY Left 01/09/2015    SOCIAL HISTORY:   Social History   Social History  . Marital status: Married    Spouse name: Onalee Hua  . Number of children: 1  . Years of education: masters   Occupational History  . 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 Smoking status: Former Smoker    Packs/day: 0.30    Years: 10.00    Types: Cigarettes    Quit date: 05/12/1978  . Smokeless tobacco: Never Used  . Alcohol use 0.0 oz/week     Comment: 1-2 glasses of wine a week  . Drug use: No  . Sexual activity: Not on file   Other Topics Concern  . Not on file   Social History Narrative   Patient is retired . Patient works part time at [employer]. Patient has a Scientist, water quality.   Caffeine- One cup daily   Right handed.    FAMILY HISTORY:   Family Status  Relation Status  . Mother Alive  . Father Deceased  . Brother  Alive  . Son Alive    ROS:  A complete 10 system review of systems was obtained and was unremarkable apart from what is mentioned above.  PHYSICAL EXAMINATION:    VITALS:   Vitals:   09/04/16 1442  BP: 128/72  Pulse: 94  SpO2: 97%  Weight: 169 lb (76.7 kg)  Height: 5' 4.5" (1.638 m)    GEN:  The patient appears stated age and is in NAD. HEENT:  Normocephalic, atraumatic.  The mucous membranes are moist. The superficial temporal arteries are without ropiness or tenderness. CV:  RRR Lungs:  CTAB Neck/HEME:  There are no carotid bruits bilaterally.  Neurological examination:  Orientation: The patient is alert and oriented x3.  Cranial nerves: There is good facial symmetry. No significant facial hypomimia.  . Extraocular muscles are intact. The visual fields are full to confrontational testing. The speech is fluent and clear. Soft palate rises symmetrically and there is no tongue deviation. Hearing is intact to conversational tone. Sensation: Sensation is intact to light touch throughout. Motor: Strength is 5/5 in the bilateral upper and lower extremities.   Shoulder shrug is equal and symmetric.  There is no pronator drift.   Movement examination: Tone: There is mild increased tone in the RUE Abnormal movements: no tremor or dyskinesia today Coordination:  There is mild decremation with finger taps, heel taps and toe taps on the right.  All other RAMs are normal.   Gait and Station: The patient has no difficulty arising out of a deep-seated chair without the use of the hands. The patient's stride length is good with slightly decreased arm swing on the right.  She drags with R leg with ambulation and slightly walks on the toe on the right.  The patient has a negative pull test.      ASSESSMENT/PLAN:  1.  Idiopathic Parkinson's disease.  The patient has tremor, bradykinesia, rigidity and mild postural instability.  Dx in 2012 per pt.  -We discussed the diagnosis as well as  pathophysiology of the disease.  We discussed treatment options as well as prognostic indicators.  Patient education was provided.  -continue carbidopa/levodopa 25/100 tid.  -On Requip XL, 24 mg per day.  Did discuss with her that she is on the maximum dose of this medication.  Discussed with her about the fact that we may need to lower this dosage as she ages, but right now her brain is tolerating this medication well, without side effects.  -She is now involved with the [employer] cycle program.  I congratulated her and encouraged her to continue this.  -Invited her to the Parkinson's symposium.  -Her husband was present this visit and was not last visit.  Answered his questions to the best of my ability.  2.  Parkinsons dyskinesia  -overall mild.  Pt doesn't notice it so won't treat now  3.  Follow up is anticipated in the next few months, sooner should new neurologic issues arise.  Greater than 50% 35 minute visit was spent in counseling with the patient and her husband.    Cc:  Gaye Alken, MD

## 2016-09-04 ENCOUNTER — Ambulatory Visit (INDEPENDENT_AMBULATORY_CARE_PROVIDER_SITE_OTHER): Payer: Medicare Other | Admitting: Neurology

## 2016-09-04 ENCOUNTER — Ambulatory Visit: Payer: Medicare Other | Admitting: Neurology

## 2016-09-04 ENCOUNTER — Encounter: Payer: Self-pay | Admitting: Neurology

## 2016-09-04 VITALS — BP 128/72 | HR 94 | Ht 64.5 in | Wt 169.0 lb

## 2016-09-04 DIAGNOSIS — G249 Dystonia, unspecified: Secondary | ICD-10-CM

## 2016-09-04 DIAGNOSIS — G20 Parkinson's disease: Secondary | ICD-10-CM | POA: Diagnosis not present

## 2016-09-08 ENCOUNTER — Other Ambulatory Visit: Payer: Self-pay | Admitting: Neurology

## 2016-09-17 ENCOUNTER — Ambulatory Visit: Payer: Medicare Other | Admitting: Neurology

## 2016-10-13 ENCOUNTER — Encounter: Payer: Self-pay | Admitting: Neurology

## 2016-12-23 NOTE — Progress Notes (Signed)
Gail Mercado was seen today in the movement disorders clinic for neurologic consultation at the request of Dr. Venetia Maxon.  Pts PCP is Gail Rainier, MD.  The consultation is for the evaluation of Parkinsons disease.  The records that were made available to me were reviewed.  First records I have from Dr. [name] are from 08/2012 but it is clear Gail Mercado was seen prior to that (pt states that Gail Mercado was seen since about 2012).  Pt states that first sx was R hand tremor and R hand clumsiness and that was in approximately 2011.  States that it seemed to start after a fall over a dishwasher but ortho didn't seem to think that it was all related to the shoulder.  Gail Mercado was started on requip (pt thinks in 2013) and changed to requip XL due to dizziness.   Over time this dosage has increased to 24 mg daily (been on this dose since 03/2014).  Gail Mercado was started on azilect in 2014 (pt states that Gail Mercado d/c this due to "interactions" with other meds).  Pt began to note inner anxiety and tried effexor without relief.  Gail Mercado was also noting severe tightness in the right side of the back.  carbidopa/levodopa 25/100 tid was added in 03/2016.  Pt states that within taking 2 pills Gail Mercado felt that "it was a miracle."  Her inner anxiety, writing, tremor, and back pain were all better.  Gail Mercado now takes carbidopa/levodopa 25/100 at 6am/1pm.    Specific Symptoms:  Tremor: Yes.  , only R hand but better now that on levodopa Family hx of similar:  No. Voice: no change Sleep: sleeping well  Vivid Dreams:  Yes.    Acting out dreams:  Yes.  , better now Wet Pillows: No. Postural symptoms:  Yes.  , better now that on carbidopa/levodopa  Falls?  Yes.  , last 6 months ago in the yard (fell in slick mud) Bradykinesia symptoms: was having trouble getting out of low car but better on meds Loss of smell:  No. Loss of taste:  No. Urinary Incontinence:  Yes.   (stress only, wearing pad) Difficulty Swallowing:  No. Handwriting, micrographia: Yes.   but  carbidopa/levodopa 25/100 improved it Trouble with ADL's:  No.  Trouble buttoning clothing: No. Depression:  No. (cutting back on effexor because once started levodopa Gail Mercado found that helped inner anxiety.  Only taking 2 pills per week) Memory changes:  No. Hallucinations:  No.  visual distortions: No. N/V:  No. Lightheaded:  No.  Syncope: No. Diplopia:  No. Dyskinesia:  No but states that foot taps a little but can stop it and Gail Mercado isn't sure that is dyskinesia  09/04/16 update:  Patient seen today in follow-up.  This patient is accompanied in the office by her Gail Mercado who supplements the history.  Gail Mercado is on Requip XL, 8 mg, 3 tablets per day.  Gail Mercado is also on carbidopa/levodopa 25/100, one tablet 3 times per day.  Gail Mercado denies any compulsive behaviors.  Gail Mercado denies sleep attacks.  Gail Mercado denies lightheadedness or near syncope.  No hallucinations.  No illusions or delusions.  I asked her to hold her medications before today's visit since I have never seen her off her medications.  Gail Mercado last took carbidopa/levodopa 25/100 yesterday at 1pm and requip XL yesterday night.  Pt feels stiff and Gail Mercado "aches" and feels tight.  Gail Mercado has started doing the biking at the [employer].    I also asked her to bring me her MRI  brain and Gail Mercado did that and I reviewed this.  It was dated 04/15/2012.  There was a rare T2 hyperintensity, but was otherwise normal.  12/24/16 update:  Patient seen in follow-up for her Parkinson's disease. Patient remains on ropinirole XL, 8 mg 3 times per day.  Gail Mercado is also on carbidopa/levodopa 25/100, one tablet 3 times per day.  About 1-2 times a week, Gail Mercado will take an extra 1/2 tablet during the day.   Overall, the patient feels that Gail Mercado has been stable since our last visit.  Gail Mercado has developed no new medical problems.  Gail Mercado denies any real falls but did sit in a chair that gave way and Gail Mercado fell because of that.  Gail Mercado denies any lightheadedness or near syncope.  Gail Mercado has had no hallucinations.  No visual  distortions.  Gail Mercado is doing b[facility] at the [facility].  Gail Mercado is caregiving for her mother and has moved her closer to her, which has helped her stress.  Gail Mercado does c/o constipation.  Some trouble with sleep.  Her friend gave her 1/2 an [place] and it worked.  PREVIOUS MEDICATIONS: Requip and carbidopa/levodopa  ALLERGIES:   Allergies  Allergen Reactions  . Ciprofloxacin     REACTION: unknown  . Penicillins     REACTION: hives  . Wasp Venom Swelling    CURRENT MEDICATIONS:  Outpatient Encounter Prescriptions as of 12/24/2016  Medication Sig  . aspirin-acetaminophen-caffeine (EXCEDRIN MIGRAINE) 250-250-65 MG per tablet Take 1 tablet by mouth every 6 (six) hours as needed for pain.  . Calcium Citrate-Vitamin D (CALCIUM + D PO) Take 1 tablet by mouth daily.  . carbidopa-levodopa (SINEMET IR) 25-100 MG tablet Take 1 tablet by mouth 3 (three) times daily.  . hydrochlorothiazide (HYDRODIURIL) 25 MG tablet Take 25 mg by mouth daily.  Marland Kitchen. levothyroxine (SYNTHROID, LEVOTHROID) 25 MCG tablet Take 25 mcg by mouth daily.  . Magnesium 100 MG CAPS Take by mouth.  . mometasone-formoterol (DULERA) 100-5 MCG/ACT AERO Inhale 1 puff into the lungs 2 (two) times daily.  . Probiotic Product (PROBIOTIC-10 PO) Take by mouth.  Marland Kitchen. rOPINIRole (REQUIP XL) 8 MG 24 hr tablet TAKE 3 TABLETS BY MOUTH AT BEDTIME  . venlafaxine XR (EFFEXOR-XR) 75 MG 24 hr capsule Take 1 capsule (75 mg total) by mouth daily with breakfast.   No facility-administered encounter medications on file as of 12/24/2016.     PAST MEDICAL HISTORY:   Past Medical History:  Diagnosis Date  . Anxiety   . Asthma   . Back pain   . Bronchiectasis   . Hypertension   . Parkinson disease (HCC)     PAST SURGICAL HISTORY:   Past Surgical History:  Procedure Laterality Date  . back surgery in 1996 & 2005    . breast biopsy 1976    . CATARACT EXTRACTION, BILATERAL  05/31/2015, 06-21-15  . TOE SURGERY Left 01/09/2015    SOCIAL HISTORY:   Social History    Social History  . Marital status: Married    Gail Mercado name: Gail Mercado  . Number of children: 1  . Years of education: masters   Occupational History  . 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 Smoking status: Former Smoker    Packs/day: 0.30    Years: 10.00    Types: Cigarettes    Quit date: 05/12/1978  . Smokeless tobacco: Never Used  . Alcohol use 0.0 oz/week     Comment: 1-2 glasses of wine a week  .  Drug use: No  . Sexual activity: Not on file   Other Topics Concern  . Not on file   Social History Narrative   Patient is retired . Patient works part time at [employer]. Patient has a Scientist, water quality.   Caffeine- One cup daily   Right handed.    FAMILY HISTORY:   Family Status  Relation Status  . Mother Alive  . Father Deceased  . Brother Alive  . Son Alive    ROS:  A complete 10 system review of systems was obtained and was unremarkable apart from what is mentioned above.  PHYSICAL EXAMINATION:    VITALS:   Vitals:   12/24/16 0850  BP: 134/70  Pulse: 94  SpO2: 96%  Weight: 164 lb (74.4 kg)  Height: 5\' 3"  (1.6 m)   Wt Readings from Last 3 Encounters:  12/24/16 164 lb (74.4 kg)  09/04/16 169 lb (76.7 kg)  06/03/16 171 lb (77.6 kg)     GEN:  The patient appears stated age and is in NAD. HEENT:  Normocephalic, atraumatic.  The mucous membranes are moist. The superficial temporal arteries are without ropiness or tenderness. CV:  RRR Lungs:  CTAB Neck/HEME:  There are no carotid bruits bilaterally.  Neurological examination:  Orientation: The patient is alert and oriented x3.  Cranial nerves: There is good facial symmetry. No significant facial hypomimia.  . Extraocular muscles are intact. The visual fields are full to confrontational testing. The speech is fluent and clear. Soft palate rises symmetrically and there is no tongue deviation. Hearing is intact to conversational tone. Sensation: Sensation is intact to light touch  throughout. Motor: Strength is 5/5 in the bilateral upper and lower extremities.   Shoulder shrug is equal and symmetric.  There is no pronator drift.   Movement examination: Tone: There is mild increased tone in the RUE, which is same as last visit Abnormal movements: minimal shoulder and leg dyskinesia Coordination:  There is mild decremation with hand opening and closing on the right.  Other RAMs are good. Gait and Station: The patient has no difficulty arising out of a deep-seated chair without the use of the hands. The patient's stride length is good with slightly decreased arm swing on the right.  No dragging of the leg.  The patient has a negative pull test.      ASSESSMENT/PLAN:  1.  Idiopathic Parkinson's disease.  The patient has tremor, bradykinesia, rigidity and mild postural instability.  Dx in 2012 per pt.  -We discussed the diagnosis as well as pathophysiology of the disease.  We discussed treatment options as well as prognostic indicators.  Patient education was provided.  -continue carbidopa/levodopa 25/100 tid.  Gail Mercado takes an extra 1/2 prn a few days per week.  -On Requip XL, 24 mg per day (8 mg tid)  Did discuss with her that Gail Mercado is on the maximum dose of this medication.  Discussed with her about the fact that we may need to lower this dosage as Gail Mercado ages, but right now her brain is tolerating this medication well, without side effects.  -Gail Mercado is now involved with the [employer] cycle program.  I congratulated her and encouraged her to continue this.  2.  Parkinsons dyskinesia  -overall mild.  Pt doesn't notice it so won't treat now  3.  Constipation  -talked about importance of water intake  -given copy of rancho recipe  4.  Insomnia  -add melatonin - 3 mg  5.   Follow up  is anticipated in the next few months, sooner should new neurologic issues arise.  Much greater than 50% of this visit was spent in counseling and coordinating care.  Total face to face time:  25  min    Cc:  Gail Rainier, MD

## 2016-12-24 ENCOUNTER — Encounter: Payer: Self-pay | Admitting: Neurology

## 2016-12-24 ENCOUNTER — Ambulatory Visit (INDEPENDENT_AMBULATORY_CARE_PROVIDER_SITE_OTHER): Payer: Medicare Other | Admitting: Neurology

## 2016-12-24 VITALS — BP 134/70 | HR 94 | Ht 63.0 in | Wt 164.0 lb

## 2016-12-24 DIAGNOSIS — G4701 Insomnia due to medical condition: Secondary | ICD-10-CM | POA: Diagnosis not present

## 2016-12-24 DIAGNOSIS — G249 Dystonia, unspecified: Secondary | ICD-10-CM

## 2016-12-24 DIAGNOSIS — G20 Parkinson's disease: Secondary | ICD-10-CM | POA: Diagnosis not present

## 2016-12-24 DIAGNOSIS — K5901 Slow transit constipation: Secondary | ICD-10-CM

## 2016-12-24 NOTE — Patient Instructions (Addendum)
1. Constipation and Parkinson's disease:   Rancho recipe for constipation in Parkinsons Disease:  -1 cup of bran, 2 cups of applesauce in 1 cup of prune juice  Increase fiber intake (Metamucil,vegetables)  Regular, moderate exercise can be beneficial.  Avoid medications causing constipation, such as medications like antacids with calcium or magnesium  Laxative overuse should be avoided.  Stool softeners (Colace) can help with chronic constipation.  2. Try Melanoma 3 mg tablets at bedtime for sleep.

## 2017-01-27 ENCOUNTER — Encounter: Payer: Self-pay | Admitting: Neurology

## 2017-02-24 ENCOUNTER — Encounter: Payer: Self-pay | Admitting: Neurology

## 2017-02-24 MED ORDER — VENLAFAXINE HCL ER 75 MG PO CP24: 75.0000 mg | ORAL_CAPSULE | Freq: Every day | ORAL | 1 refills | Status: DC

## 2017-02-24 MED ORDER — CARBIDOPA-LEVODOPA 25-100 MG PO TABS: 1.0000 | ORAL_TABLET | Freq: Four times a day (QID) | ORAL | 1 refills | Status: DC

## 2017-02-24 MED ORDER — ROPINIROLE HCL ER 8 MG PO TB24: 24.0000 mg | ORAL_TABLET | Freq: Every day | ORAL | 1 refills | Status: DC

## 2017-02-24 NOTE — Telephone Encounter (Signed)
I refilled Requip and Carbidopa Levodopa.  Please advise on Effexor.

## 2017-03-02 ENCOUNTER — Other Ambulatory Visit: Payer: Self-pay | Admitting: Neurology

## 2017-03-10 ENCOUNTER — Ambulatory Visit: Payer: Medicare Other | Admitting: Neurology

## 2017-04-22 ENCOUNTER — Ambulatory Visit: Payer: Medicare Other | Admitting: Neurology

## 2017-04-26 ENCOUNTER — Encounter: Payer: Self-pay | Admitting: Neurology

## 2017-05-18 ENCOUNTER — Telehealth: Payer: Self-pay | Admitting: Neurology

## 2017-05-18 NOTE — Telephone Encounter (Signed)
Patient called needing to see if a paper can be filled out regarding her Insurance and the cost of her medication. The Tiers have changed and she is needing it back at a Tier 1. She said they will be faxing a form that will need to be faxed back within 3 days. Thanks

## 2017-05-19 ENCOUNTER — Encounter: Payer: Self-pay | Admitting: Neurology

## 2017-05-19 NOTE — Telephone Encounter (Signed)
Form completed and faxed to [facility] at [phone].

## 2017-06-03 ENCOUNTER — Encounter: Payer: Self-pay | Admitting: Neurology

## 2017-06-10 NOTE — Progress Notes (Signed)
Gail Mercado was seen today in the movement disorders clinic for neurologic consultation at the request of Dr. Venetia Maxon.  Pts PCP is Gail Rainier, MD.  The consultation is for the evaluation of Parkinsons disease.  The records that were made available to me were reviewed.  First records I have from Dr. [name] are from 08/2012 but it is clear Gail Mercado was seen prior to that (pt states that Gail Mercado was seen since about 2012).  Pt states that first sx was R hand tremor and R hand clumsiness and that was in approximately 2011.  States that it seemed to start after a fall over a dishwasher but ortho didn't seem to think that it was all related to the shoulder.  Gail Mercado was started on requip (pt thinks in 2013) and changed to requip XL due to dizziness.   Over time this dosage has increased to 24 mg daily (been on this dose since 03/2014).  Gail Mercado was started on azilect in 2014 (pt states that Gail Mercado d/c this due to "interactions" with other meds).  Pt began to note inner anxiety and tried effexor without relief.  Gail Mercado was also noting severe tightness in the right side of the back.  carbidopa/levodopa 25/100 tid was added in 03/2016.  Pt states that within taking 2 pills Gail Mercado felt that "it was a miracle."  Her inner anxiety, writing, tremor, and back pain were all better.  Gail Mercado now takes carbidopa/levodopa 25/100 at 6am/1pm.    Specific Symptoms:  Tremor: Yes.  , only R hand but better now that on levodopa Family hx of similar:  No. Voice: no change Sleep: sleeping well  Vivid Dreams:  Yes.    Acting out dreams:  Yes.  , better now Wet Pillows: No. Postural symptoms:  Yes.  , better now that on carbidopa/levodopa  Falls?  Yes.  , last 6 months ago in the yard (fell in slick mud) Bradykinesia symptoms: was having trouble getting out of low car but better on meds Loss of smell:  No. Loss of taste:  No. Urinary Incontinence:  Yes.   (stress only, wearing pad) Difficulty Swallowing:  No. Handwriting, micrographia: Yes.   but  carbidopa/levodopa 25/100 improved it Trouble with ADL's:  No.  Trouble buttoning clothing: No. Depression:  No. (cutting back on effexor because once started levodopa Gail Mercado found that helped inner anxiety.  Only taking 2 pills per week) Memory changes:  No. Hallucinations:  No.  visual distortions: No. N/V:  No. Lightheaded:  No.  Syncope: No. Diplopia:  No. Dyskinesia:  No but states that foot taps a little but can stop it and Gail Mercado isn't sure that is dyskinesia  09/04/16 update:  Patient seen today in follow-up.  This patient is accompanied in the office by her spouse who supplements the history.  Gail Mercado is on Requip XL, 8 mg, 3 tablets per day.  Gail Mercado is also on carbidopa/levodopa 25/100, one tablet 3 times per day.  Gail Mercado denies any compulsive behaviors.  Gail Mercado denies sleep attacks.  Gail Mercado denies lightheadedness or near syncope.  No hallucinations.  No illusions or delusions.  I asked her to hold her medications before today's visit since I have never seen her off her medications.  Gail Mercado last took carbidopa/levodopa 25/100 yesterday at 1pm and requip XL yesterday night.  Pt feels stiff and Gail Mercado "aches" and feels tight.  Gail Mercado has started doing the biking at the [employer].    I also asked her to bring me her MRI  brain and Gail Mercado did that and I reviewed this.  It was dated 04/15/2012.  There was a rare T2 hyperintensity, but was otherwise normal.  12/24/16 update:  Patient seen in follow-up for her Parkinson's disease. Patient remains on ropinirole XL, 8 mg 3 times per day.  Gail Mercado is also on carbidopa/levodopa 25/100, one tablet 3 times per day.  About 1-2 times a week, Gail Mercado will take an extra 1/2 tablet during the day.   Overall, the patient feels that Gail Mercado has been stable since our last visit.  Gail Mercado has developed no new medical problems.  Gail Mercado denies any real falls but did sit in a chair that gave way and Gail Mercado fell because of that.  Gail Mercado denies any lightheadedness or near syncope.  Gail Mercado has had no hallucinations.  No visual  distortions.  Gail Mercado is doing biking at the [facility].  Gail Mercado is caregiving for her mother and has moved her closer to her, which has helped her stress.  Gail Mercado does c/o constipation.  Some trouble with sleep.  Her friend gave her 1/2 an [place] and it worked.  06/12/17 update: Patient is seen today in follow-up for Parkinson's disease.  Gail Mercado is on ropinirole XL, 8 mg 3 at bedtime.  I increased her carbidopa/levodopa 25/100 to qid (6am/10am/2pm/6pm) dosing since our last visit.  Gail Mercado feels that it may be running out about 30 min before it is due to take the next pill.  Hands feel stiff but wonders if that is maybe arthritis.  Gail Mercado has sent me multiple emails since our last visit.  Pt denies falls.  Pt denies lightheadedness, near syncope.  No hallucinations.  Mood has been pretty good on effexor XR 75 mg daily.  Her mother did die in November - pt had been taking care of her for 9 years and pt is having trouble finding a new normal.  Gail Mercado is still going to [facility] cycle class.  Her yoga class was cancelled.  Gained weight with winter and stress of mom dying.   Gail Mercado did go to her PCP about shoulder pain after I told her that it wasn't a PD sx and was given prednisone and that helped.    PREVIOUS MEDICATIONS: Requip and carbidopa/levodopa  ALLERGIES:   Allergies  Allergen Reactions  . Ciprofloxacin     REACTION: unknown  . Penicillins     REACTION: hives  . Wasp Venom Swelling    CURRENT MEDICATIONS:  Outpatient Encounter Medications as of 06/12/2017  Medication Sig  . aspirin-acetaminophen-caffeine (EXCEDRIN MIGRAINE) 250-250-65 MG per tablet Take 1 tablet by mouth every 6 (six) hours as needed for pain.  . Calcium Citrate-Vitamin D (CALCIUM + D PO) Take 1 tablet by mouth daily.  . carbidopa-levodopa (SINEMET IR) 25-100 MG tablet Take 1 tablet by mouth 4 (four) times daily.  . hydrochlorothiazide (HYDRODIURIL) 25 MG tablet Take 25 mg by mouth daily.  Marland Kitchen levothyroxine (SYNTHROID, LEVOTHROID) 25 MCG tablet Take 25 mcg by  mouth daily.  . Magnesium 100 MG CAPS Take by mouth.  . mometasone-formoterol (DULERA) 100-5 MCG/ACT AERO Inhale 1 puff into the lungs 2 (two) times daily.  . Probiotic Product (PROBIOTIC-10 PO) Take by mouth.  Marland Kitchen rOPINIRole (REQUIP XL) 8 MG 24 hr tablet Take 3 tablets (24 mg total) by mouth at bedtime.  Marland Kitchen venlafaxine XR (EFFEXOR-XR) 75 MG 24 hr capsule Take 1 capsule (75 mg total) by mouth daily with breakfast.   No facility-administered encounter medications on file as of 06/12/2017.  PAST MEDICAL HISTORY:   Past Medical History:  Diagnosis Date  . Anxiety   . Asthma   . Back pain   . Bronchiectasis   . Hypertension   . Parkinson disease (HCC)     PAST SURGICAL HISTORY:   Past Surgical History:  Procedure Laterality Date  . back surgery in 1996 & 2005    . breast biopsy 1976    . CATARACT EXTRACTION, BILATERAL  05/31/2015, 06-21-15  . TOE SURGERY Left 01/09/2015    SOCIAL HISTORY:   Social History   Socioeconomic History  . Marital status: Married    Spouse name: Onalee HuaDavid  . Number of children: 1  . Years of education: masters  . Highest education level: Not on file  Social Needs  . Financial resource strain: Not on file  . Food insecurity - worry: Not on file  . Food insecurity - inability: Not on file  . Transportation needs - medical: Not on file  . Transportation needs - non-medical: Not on file  Occupational History  . Occupation: retired    Associate [employer]ormployer: [employer]    Comment: HS math  Tobacco Use  . Smoking status: Former Smoker    Packs/day: 0.30    Years: 10.00    Pack years: 3.00    Types: Cigarettes    Last attempt to quit: 05/12/1978    Years since quitting: 39.1  . Smokeless tobacco: Never Used  Substance and Sexual Activity  . Alcohol use: Yes    Alcohol/week: 0.0 oz    Comment: 1-2 glasses of wine a week  . Drug use: No  . Sexual activity: Not on file  Other Topics Concern  . Not on file  Social History Narrative   Patient is retired .  Patient works part time at [employer]. Patient has a [employer].   Caffeine- One cup daily   Right handed.    FAMILY HISTORY:   Family Status  Relation Name Status  . Mother  Alive  . Father  Deceased  . Brother  Alive  . Son  Alive    ROS:  A complete 10 system review of systems was obtained and was unremarkable apart from what is mentioned above.  PHYSICAL EXAMINATION:    VITALS:   There were no vitals filed for this visit. Wt Readings from Last 3 Encounters:  12/24/16 164 lb (74.4 kg)  09/04/16 169 lb (76.7 kg)  06/03/16 171 lb (77.6 kg)     GEN:  The patient appears stated age and is in NAD. HEENT:  Normocephalic, atraumatic.  The mucous membranes are moist. The superficial temporal arteries are without ropiness or tenderness. CV:  RRR Lungs:  CTAB Neck/HEME:  There are no carotid bruits bilaterally.  Neurological examination:  Orientation: The patient is alert and oriented x3.  Cranial nerves: There is good facial symmetry. No significant facial hypomimia.  . Extraocular muscles are intact. The visual fields are full to confrontational testing. The speech is fluent and clear. Soft palate rises symmetrically and there is no tongue deviation. Hearing is intact to conversational tone. Sensation: Sensation is intact to light touch throughout. Motor: Strength is 5/5 in the bilateral upper and lower extremities.   Shoulder shrug is equal and symmetric.  There is no pronator drift.   Movement examination: Tone: There is minimal rigidity in the RUE Abnormal movements: minimal rigidity in the RLE Coordination:  There is mild decremation with hand opening and closing on the right.  Other RAMs are  good. Gait and Station: The patient has no trouble getting OOC without hands.  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  Idiopathic Parkinson's disease.  The patient has tremor, bradykinesia, rigidity and mild postural instability.  Dx in 2012 per pt.  -We  discussed the diagnosis as well as pathophysiology of the disease.  We discussed treatment options as well as prognostic indicators.  Patient education was provided.  -carbidopa/levodopa 25/100, 1 po qid.    -Add comtan to see if we can help the wearing off the 30 min prior to next dosing.  Risks, benefits, side effects and alternative therapies were discussed.  The opportunity to ask questions was given and they were answered to the best of my ability.  The patient expressed understanding and willingness to follow the outlined treatment protocols.  Will let me know if has diarrhea.  Warned of orange urine.  -On Requip XL, 24 mg per day (3 at bedtime)  Did discuss with her that Gail Mercado is on the maximum dose of this medication.  Discussed with her about the fact that we may need to lower this dosage as Gail Mercado ages, but right now her brain is tolerating this medication well, without side effects.  -Gail Mercado is now involved with the [employer] cycle program.  I congratulated her and encouraged her to continue this.  2.  Parkinsons dyskinesia  -overall mild.  Pt doesn't notice it so won't treat now  3.  Constipation  -talked about importance of water intake  -given copy of rancho recipe  4.  Insomnia  -try 3-8mg  of melatonin.  Tried 3 mg without relief  5.   Follow up is anticipated in the next few months, sooner should new neurologic issues arise.  Much greater than 50% of this visit was spent in counseling and coordinating care.  Total face to face time:  25 min    Cc:  Gail Rainier, MD

## 2017-06-12 ENCOUNTER — Ambulatory Visit: Payer: Medicare Other | Admitting: Neurology

## 2017-06-12 ENCOUNTER — Encounter: Payer: Self-pay | Admitting: Neurology

## 2017-06-12 VITALS — BP 118/72 | HR 96 | Ht 64.0 in | Wt 171.0 lb

## 2017-06-12 DIAGNOSIS — G20 Parkinson's disease: Secondary | ICD-10-CM | POA: Diagnosis not present

## 2017-06-12 DIAGNOSIS — G249 Dystonia, unspecified: Secondary | ICD-10-CM

## 2017-06-12 MED ORDER — ENTACAPONE 200 MG PO TABS: 200.0000 mg | ORAL_TABLET | Freq: Four times a day (QID) | ORAL | 1 refills | Status: DC

## 2017-06-12 NOTE — Patient Instructions (Signed)
Add entacapone - 200 mg - take 1 with every carbidopa/levodopa 25/100 dosage.

## 2017-06-18 ENCOUNTER — Encounter: Payer: Self-pay | Admitting: Neurology

## 2017-07-02 ENCOUNTER — Encounter: Payer: Self-pay | Admitting: Neurology

## 2017-07-06 ENCOUNTER — Encounter: Payer: Self-pay | Admitting: Neurology

## 2017-07-16 ENCOUNTER — Encounter: Payer: Self-pay | Admitting: Neurology

## 2017-07-16 ENCOUNTER — Other Ambulatory Visit: Payer: Self-pay | Admitting: Neurology

## 2017-08-09 ENCOUNTER — Encounter: Payer: Self-pay | Admitting: Neurology

## 2017-09-17 ENCOUNTER — Other Ambulatory Visit: Payer: Self-pay | Admitting: Neurology

## 2017-09-21 ENCOUNTER — Encounter: Payer: Self-pay | Admitting: Gastroenterology

## 2017-09-30 ENCOUNTER — Other Ambulatory Visit: Payer: Self-pay | Admitting: Neurology

## 2017-10-06 ENCOUNTER — Telehealth: Payer: Self-pay | Admitting: Neurology

## 2017-10-06 NOTE — Telephone Encounter (Signed)
Received a copy of the patient's lab work dated Sep 30, 2016.  Sodium was 140, potassium 4.2, chloride 98, CO2 31, BUN 20, creatinine 0.83, glucose 84.  AST 14, ALT less than 3, alkaline phosphatase 100.  TSH was 3.52.  B12 was low at 174.

## 2017-10-12 NOTE — Progress Notes (Signed)
Gail Mercado was seen today in the movement disorders clinic for neurologic consultation at the request of Dr. Venetia Maxon.  Pts PCP is [name], MD.  The consultation is for the evaluation of Parkinsons disease.  The records that were made available to me were reviewed.  First records I have from Dr. [name] are from 08/2012 but it is clear she was seen prior to that (pt states that she was seen since about 2012).  Pt states that first sx was R hand tremor and R hand clumsiness and that was in approximately 2011.  States that it seemed to start after a fall over a dishwasher but ortho didn't seem to think that it was all related to the shoulder.  She was started on requip (pt thinks in 2013) and changed to requip XL due to dizziness.   Over time this dosage has increased to 24 mg daily (been on this dose since 03/2014).  She was started on azilect in 2014 (pt states that she d/c this due to "interactions" with other meds).  Pt began to note inner anxiety and tried effexor without relief.  She was also noting severe tightness in the right side of the back.  carbidopa/levodopa 25/100 tid was added in 03/2016.  Pt states that within taking 2 pills she felt that "it was a miracle."  Her inner anxiety, writing, tremor, and back pain were all better.  She now takes carbidopa/levodopa 25/100 at 6am/1pm.    Specific Symptoms:  Tremor: Yes.  , only R hand but better now that on levodopa Family hx of similar:  No. Voice: no change Sleep: sleeping well  Vivid Dreams:  Yes.    Acting out dreams:  Yes.  , better now Wet Pillows: No. Postural symptoms:  Yes.  , better now that on carbidopa/levodopa  Falls?  Yes.  , last 6 months ago in the yard (fell in slick mud) Bradykinesia symptoms: was having trouble getting out of low car but better on meds Loss of smell:  No. Loss of taste:  No. Urinary Incontinence:  Yes.   (stress only, wearing pad) Difficulty Swallowing:  No. Handwriting, micrographia: Yes.   but  carbidopa/levodopa 25/100 improved it Trouble with ADL's:  No.  Trouble buttoning clothing: No. Depression:  No. (cutting back on effexor because once started levodopa she found that helped inner anxiety.  Only taking 2 pills per week) Memory changes:  No. Hallucinations:  No.  visual distortions: No. N/V:  No. Lightheaded:  No.  Syncope: No. Diplopia:  No. Dyskinesia:  No but states that foot taps a little but can stop it and she isn't sure that is dyskinesia  09/04/16 update:  Patient seen today in follow-up.  This patient is accompanied in the office by her spouse who supplements the history.  She is on Requip XL, 8 mg, 3 tablets per day.  She is also on carbidopa/levodopa 25/100, one tablet 3 times per day.  She denies any compulsive behaviors.  She denies sleep attacks.  She denies lightheadedness or near syncope.  No hallucinations.  No illusions or delusions.  I asked her to hold her medications before today's visit since I have never seen her off her medications.  She last took carbidopa/levodopa 25/100 yesterday at 1pm and requip XL yesterday night.  Pt feels stiff and she "aches" and feels tight.  She has started doing the biking at the [employer].    I also asked her to bring me her MRI  brain and she did that and I reviewed this.  It was dated 04/15/2012.  There was a rare T2 hyperintensity, but was otherwise normal.  12/24/16 update:  Patient seen in follow-up for her Parkinson's disease. Patient remains on ropinirole XL, 8 mg 3 times per day.  She is also on carbidopa/levodopa 25/100, one tablet 3 times per day.  About 1-2 times a week, she will take an extra 1/2 tablet during the day.   Overall, the patient feels that she has been stable since our last visit.  She has developed no new medical problems.  She denies any real falls but did sit in a chair that gave way and she fell because of that.  She denies any lightheadedness or near syncope.  She has had no hallucinations.  No visual  distortions.  [facility]  She is caregiving for her mother and has moved her closer to her, which has helped her stress.  She does c/o constipation.  Some trouble with sleep.  Her friend gave her 1/2 an [place] and it worked.  06/12/17 update: Patient is seen today in follow-up for Parkinson's disease.  She is on ropinirole XL, 8 mg 3 at bedtime.  I increased her carbidopa/levodopa 25/100 to qid (6am/10am/2pm/6pm) dosing since our last visit.  She feels that it may be running out about 30 min before it is due to take the next pill.  Hands feel stiff but wonders if that is maybe arthritis.  She has sent me multiple emails since our last visit.  Pt denies falls.  Pt denies lightheadedness, near syncope.  No hallucinations.  Mood has been pretty good on effexor XR 75 mg daily.  Her mother did die in November - pt had been taking care of her for 9 years and pt is having trouble finding a new normal.  She is [facility].  Her yoga class was cancelled.  Gained weight with winter and stress of mom dying.   She did go to her PCP about shoulder pain after I told her that it wasn't a PD sx and was given prednisone and that helped.    10/14/17 update: Patient is seen today for follow-up of Parkinson's disease.  She is on Requip XL, 24 mg at nighttime.  She has had no hallucinations or compulsive behavior.  She is on carbidopa/levodopa 25/100, 1 tablet 4 times per day (rarely 5 times) and last visit entacapone was added to those dosages.  She reported that this made her feel worse and she ultimately discontinued the medication after a short time trying it.  She is due for a colonoscopy and is scheduled for an appt with Dr. Russella DarStark in July. She is dealing with constipation.  Trying to drink "more water" but drinking about 4 bottles per day.  Taking colace bid.  Trouble with sleep and "can you give me something for it."  Mostly trouble staying asleep.  One episode of sleeping with girlfriend  and in sleep sat straight up in bed.  Was yelling out but thinks that is better.   Mood has been good.  On low dose effexor, 75 mg.  Trying to settle her moms estate.   PREVIOUS MEDICATIONS: Requip and carbidopa/levodopa; entacapone (felt made her worse and d/c after few days)  ALLERGIES:   Allergies  Allergen Reactions  . Ciprofloxacin     REACTION: unknown  . Penicillins     REACTION: hives  . Wasp Venom  Swelling    CURRENT MEDICATIONS:  Outpatient Encounter Medications as of 10/14/2017  Medication Sig  . aspirin-acetaminophen-caffeine (EXCEDRIN MIGRAINE) 250-250-65 MG per tablet Take 1 tablet by mouth every 6 (six) hours as needed for pain.  . Calcium Citrate-Vitamin D (CALCIUM + D PO) Take 1 tablet by mouth daily.  . carbidopa-levodopa (SINEMET IR) 25-100 MG tablet TAKE 1 TABLET BY MOUTH FOUR TIMES A DAY (Patient taking differently: TAKE 1 TABLET BY MOUTH 4-5 TIMES A DAY)  . cholecalciferol (VITAMIN D) 1000 units tablet Take 1,000 Units by mouth daily.  Marland Kitchen docusate sodium (COLACE) 100 MG capsule Take 100 mg by mouth 2 (two) times daily.  . hydrochlorothiazide (HYDRODIURIL) 25 MG tablet Take 25 mg by mouth daily.  Marland Kitchen levothyroxine (SYNTHROID, LEVOTHROID) 25 MCG tablet Take 25 mcg by mouth daily.  . mometasone-formoterol (DULERA) 100-5 MCG/ACT AERO Inhale 1 puff into the lungs 2 (two) times daily.  . polyethylene glycol (MIRALAX / GLYCOLAX) packet Take 17 g by mouth daily.  . Probiotic Product (PROBIOTIC-10 PO) Take by mouth.  Marland Kitchen rOPINIRole (REQUIP XL) 8 MG 24 hr tablet TAKE 3 TABLETS (24 MG TOTAL) BY MOUTH AT BEDTIME.  Marland Kitchen venlafaxine XR (EFFEXOR-XR) 75 MG 24 hr capsule Take 1 capsule (75 mg total) by mouth daily with breakfast.  . vitamin B-12 (CYANOCOBALAMIN) 1000 MCG tablet Take 1,000 mcg by mouth daily.  . [DISCONTINUED] entacapone (COMTAN) 200 MG tablet Take 1 tablet (200 mg total) by mouth 4 (four) times daily.  . [DISCONTINUED] ibuprofen (ADVIL,MOTRIN) 200 MG tablet Take 200 mg by  mouth at bedtime.   No facility-administered encounter medications on file as of 10/14/2017.     PAST MEDICAL HISTORY:   Past Medical History:  Diagnosis Date  . Anxiety   . Asthma   . Back pain   . Bronchiectasis   . Hypertension   . Parkinson disease (HCC)     PAST SURGICAL HISTORY:   Past Surgical History:  Procedure Laterality Date  . back surgery in 1996 & 2005    . breast biopsy 1976    . CATARACT EXTRACTION, BILATERAL  05/31/2015, 06-21-15  . TOE SURGERY Left 01/09/2015    SOCIAL HISTORY:   Social History   Socioeconomic History  . Marital status: Married    Spouse name: Onalee Hua  . Number of children: 1  . Years of education: masters  . Highest education level: Not on file  Occupational History  . Occupation: retired    Associate Professor: Arboriculturist    Comment: HS math  Social Needs  . Financial resource strain: Not on file  . Food insecurity:    Worry: Not on file    Inability: Not on file  . Transportation needs:    Medical: Not on file    Non-medical: Not on file  Tobacco Use  . Smoking status: Former Smoker    Packs/day: 0.30    Years: 10.00    Pack years: 3.00    Types: Cigarettes    Last attempt to quit: 05/12/1978    Years since quitting: 39.4  . Smokeless tobacco: Never Used  Substance and Sexual Activity  . Alcohol use: Yes    Alcohol/week: 0.0 oz    Comment: 1-2 glasses of wine a week  . Drug use: No  . Sexual activity: Not on file  Lifestyle  . Physical activity:    Days per week: Not on file    Minutes per session: Not on file  . Stress: Not on file  Relationships  . Social connections:    Talks on phone: Not on file    Gets together: Not on file    Attends religious service: Not on file    Active member of club or organization: Not on file    Attends meetings of clubs or organizations: Not on file    Relationship status: Not on file  . Intimate partner violence:    Fear of current or ex partner: Not on file    Emotionally abused:  Not on file    Physically abused: Not on file    Forced sexual activity: Not on file  Other Topics Concern  . Not on file  Social History Narrative   Patient is retired . Patient works part time at [employer]. Patient has a Scientist, water quality.   Caffeine- One cup daily   Right handed.    FAMILY HISTORY:   Family Status  Relation Name Status  . Mother  Alive  . Father  Deceased  . Brother  Alive  . Son  Alive    ROS:  Review of Systems  Constitutional: Negative.   HENT: Negative.   Eyes: Negative.   Respiratory: Negative.   Cardiovascular: Negative.   Gastrointestinal: Positive for constipation.  Genitourinary: Negative.   Skin: Negative.   Endo/Heme/Allergies: Negative.     PHYSICAL EXAMINATION:    VITALS:   Vitals:   10/14/17 0937  BP: 136/68  Pulse: 86  SpO2: 93%  Weight: 170 lb (77.1 kg)  Height: 5' 3.5" (1.613 m)   Wt Readings from Last 3 Encounters:  10/14/17 170 lb (77.1 kg)  06/12/17 171 lb (77.6 kg)  12/24/16 164 lb (74.4 kg)   GEN:  The patient appears stated age and is in NAD. HEENT:  Normocephalic, atraumatic.  The mucous membranes are moist. The superficial temporal arteries are without ropiness or tenderness. CV:  RRR Lungs:  CTAB Neck/HEME:  There are no carotid bruits bilaterally.  Neurological examination:  Orientation: The patient is alert and oriented x3. Cranial nerves: There is good facial symmetry. The speech is fluent and clear. Soft palate rises symmetrically and there is no tongue deviation. Hearing is intact to conversational tone. Sensation: Sensation is intact to light touch throughout Motor: Strength is 5/5 in the bilateral upper and lower extremities.   Shoulder shrug is equal and symmetric.  There is no pronator drift.  Movement examination: Tone: There is normal in the UE/LE Abnormal movements: none Coordination:  There is no decremation with RAM's, with any form of RAMS, including alternating supination and pronation of the  forearm, hand opening and closing, finger taps, heel taps and toe taps. Gait and Station: The patient has no difficulty arising out of a deep-seated chair without the use of the hands. The patient's stride length is normal with good arm swing.     ASSESSMENT/PLAN:  1.  Idiopathic Parkinson's disease.  The patient has tremor, bradykinesia, rigidity and mild postural instability.  Dx in 2012 per pt.  -We discussed the diagnosis as well as pathophysiology of the disease.  We discussed treatment options as well as prognostic indicators.  Patient education was provided.  -she will continue carbidopa/levodopa 25/100, 1 po qid.  Can take an extra prn.    -On Requip XL, 24 mg per day (3 at bedtime).  Left on this due to no compulsive behaviors.  -information given on upcoming PD symposium  -pt education given  2.  Parkinsons dyskinesia  -overall mild.  Pt doesn't notice it  so won't treat now  3.  Constipation  -reinforced water intake  -on probiotic  -has appt with Dr. Russella Dar  4.  Insomnia with occasional RBD  -melatonin not helping  -add klonopin - 0.5 mg - 1/2 tablet at bed.  Risks, benefits, side effects and alternative therapies were discussed.  The opportunity to ask questions was given and they were answered to the best of my ability.  The patient expressed understanding and willingness to follow the outlined treatment protocols.  5.   Follow up is anticipated in the next 5 months, sooner should new neurologic issues arise.  Much greater than 50% of this visit was spent in counseling and coordinating care.  Total face to face time:  25 min    Cc:  [name], MD

## 2017-10-14 ENCOUNTER — Ambulatory Visit: Payer: Medicare Other | Admitting: Neurology

## 2017-10-14 ENCOUNTER — Encounter: Payer: Self-pay | Admitting: Neurology

## 2017-10-14 VITALS — BP 136/68 | HR 86 | Ht 63.5 in | Wt 170.0 lb

## 2017-10-14 DIAGNOSIS — G4752 REM sleep behavior disorder: Secondary | ICD-10-CM

## 2017-10-14 DIAGNOSIS — G249 Dystonia, unspecified: Secondary | ICD-10-CM | POA: Diagnosis not present

## 2017-10-14 DIAGNOSIS — K5901 Slow transit constipation: Secondary | ICD-10-CM | POA: Diagnosis not present

## 2017-10-14 DIAGNOSIS — G20 Parkinson's disease: Secondary | ICD-10-CM | POA: Diagnosis not present

## 2017-10-14 DIAGNOSIS — G4701 Insomnia due to medical condition: Secondary | ICD-10-CM

## 2017-10-14 MED ORDER — CLONAZEPAM 0.5 MG PO TABS: 0.2500 mg | ORAL_TABLET | Freq: Every day | ORAL | 1 refills | Status: DC

## 2017-10-14 NOTE — Patient Instructions (Signed)
Registration is OPEN!    Third Annual Parkinson's Education Symposium   To register: [email or web address]      Search:  [facility] [employer]ckerParkinson's Symposium  Register EACH person attending individually Questions: Contact Link SnufferJessica Thomas, Alexander MtLCSW  [phone] or Shanda [email or web address]    [facility] Ad[facility] with Parkinson's Thursdays 9:30-10:30 am May 2, May 9, & May 16 & Tuesdays 10:00-11:00 am June 4, June 18, & July 2 [facility] [facility] [employer], [address] To register: [phone] or email [email or web address]        [facility]'s [facility] Movement [facility]  The [place] [facility] Movement [facility] team know that living well with a movement disorder extends far beyond our clinic walls. We are together with you. Our team is passionate about providing resources to you and your loved ones who are living with Parkinson's disease and movement disorders. Participate in these programs and join our community. These resources are free or low cost!   [place] [facility] is adding:   Innovative educational programs for patients and caregivers.   Support groups for patients and caregivers living with Parkinson's disease.   Parkinson's specific exercise programs.   Custom tailored therapeutic programs that will benefit patient's living with Parkinson's disease.   We are in this together. You can help and contribute to grow these programs and resources in our community. 100% of the funds donated to the Movement Disorders Fund stays right here in our community to support patients and their caregivers.  To make a tax deductible contribution:  -ask for a [facility] Parkinson's envelope in the office today.  - call the Office of Institutional Advancement at [phone].

## 2017-10-25 ENCOUNTER — Other Ambulatory Visit: Payer: Self-pay | Admitting: Neurology

## 2017-11-05 ENCOUNTER — Encounter: Payer: Self-pay | Admitting: Neurology

## 2017-11-19 ENCOUNTER — Ambulatory Visit: Payer: Medicare Other | Admitting: Gastroenterology

## 2017-12-23 ENCOUNTER — Other Ambulatory Visit: Payer: Self-pay | Admitting: Neurology

## 2018-01-07 ENCOUNTER — Telehealth: Payer: Self-pay | Admitting: Gastroenterology

## 2018-01-07 NOTE — Telephone Encounter (Signed)
ROI fax to Dr. Rush LandmarkJyothi Mann [facility] for records.

## 2018-02-09 ENCOUNTER — Ambulatory Visit: Payer: Medicare Other | Admitting: Gastroenterology

## 2018-02-09 ENCOUNTER — Encounter: Payer: Self-pay | Admitting: Gastroenterology

## 2018-02-09 VITALS — BP 128/72 | HR 82 | Ht 63.5 in | Wt 165.1 lb

## 2018-02-09 DIAGNOSIS — Z86010 Personal history of colonic polyps: Secondary | ICD-10-CM | POA: Diagnosis not present

## 2018-02-09 DIAGNOSIS — K5900 Constipation, unspecified: Secondary | ICD-10-CM | POA: Diagnosis not present

## 2018-02-09 MED ORDER — NA SULFATE-K SULFATE-MG SULF 17.5-3.13-1.6 GM/177ML PO SOLN: 1.0000 | Freq: Once | ORAL | 0 refills | Status: AC

## 2018-02-09 NOTE — Progress Notes (Signed)
History of Present Illness: This is a 67 year old self referred for the evaluation of constipation, personal history of adenomatous colon polyps.  She underwent colonoscopy by Dr. Loreta Ave in November 2014 with findings of sigmoid colon diverticulosis and a single 6 mm sigmoid colon polyp was removed which was a tubular adenoma.  She was recommended to have a 5-year surveillance colonoscopy.  She was diagnosed with Parkinson's disease in 2015 and since that time has had difficulties with constipation.  Her current regimen includes Colace 4 per day, Dulcolax suppositories as needed and MiraLAX as needed and this has not been effective.  She states she tried Linzess and is not sure why she discontinued it.  She relates difficulties with abdominal bloating. She does not feel she has complete evacuation with bowel movements.  Denies weight loss, abdominal pain, diarrhea, change in stool caliber, melena, hematochezia, nausea, vomiting, dysphagia, reflux symptoms, chest pain.     Allergies  Allergen Reactions  . Ciprofloxacin     REACTION: unknown  . Penicillins     REACTION: hives  . Wasp Venom Swelling   Outpatient Medications Prior to Visit  Medication Sig Dispense Refill  . aspirin-acetaminophen-caffeine (EXCEDRIN MIGRAINE) 250-250-65 MG per tablet Take 1 tablet by mouth every 6 (six) hours as needed for pain.    . Calcium Citrate-Vitamin D (CALCIUM + D PO) Take 1 tablet by mouth daily.    . carbidopa-levodopa (SINEMET IR) 25-100 MG tablet TAKE 1 TABLET BY MOUTH FOUR TIMES A DAY 360 tablet 1  . cholecalciferol (VITAMIN D) 1000 units tablet Take 1,000 Units by mouth daily.    . clonazePAM (KLONOPIN) 0.5 MG tablet Take 0.5 tablets (0.25 mg total) by mouth at bedtime. 45 tablet 1  . docusate sodium (COLACE) 100 MG capsule Take 200 mg by mouth 2 (two) times daily.     . hydrochlorothiazide (HYDRODIURIL) 25 MG tablet Take 25 mg by mouth daily.    Marland Kitchen levothyroxine (SYNTHROID, LEVOTHROID) 25 MCG tablet  Take 25 mcg by mouth daily.    . mometasone-formoterol (DULERA) 100-5 MCG/ACT AERO Inhale 1 puff into the lungs 2 (two) times daily. 1 Inhaler 11  . polyethylene glycol (MIRALAX / GLYCOLAX) packet Take 17 g by mouth daily as needed.     . Probiotic Product (PROBIOTIC-10 PO) Take by mouth.    Marland Kitchen rOPINIRole (REQUIP XL) 8 MG 24 hr tablet TAKE 3 TABLETS (24 MG TOTAL) BY MOUTH AT BEDTIME. 270 tablet 0  . venlafaxine XR (EFFEXOR-XR) 75 MG 24 hr capsule TAKE 1 CAPSULE (75 MG TOTAL) BY MOUTH DAILY WITH BREAKFAST. 90 capsule 1  . vitamin B-12 (CYANOCOBALAMIN) 1000 MCG tablet Take 1,000 mcg by mouth daily.     No facility-administered medications prior to visit.    Past Medical History:  Diagnosis Date  . Anxiety   . Asthma   . Back pain   . Bronchiectasis   . Hypertension   . Parkinson disease [facility])    Past Surgical History:  Procedure Laterality Date  . back surgery in 1996 & 2005    . breast biopsy 1976    . CATARACT EXTRACTION, BILATERAL  05/31/2015, 06-21-15  . TOE SURGERY Left 01/09/2015   Social History   Socioeconomic History  . Marital status: Married    Spouse name: Onalee Hua  . Number of children: 1  . Years of education: masters  . Highest education level: Not on file  Occupational History  . 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 Financial resource strain: Not on file  . Food insecurity:    Worry: Not on file    Inability: Not on file  . Transportation needs:    Medical: Not on file    Non-medical: Not on file  Tobacco Use  . Smoking status: Former Smoker    Packs/day: 0.30    Years: 10.00    Pack years: 3.00    Types: Cigarettes    Last attempt to quit: 05/12/1978    Years since quitting: 39.7  . Smokeless tobacco: Never Used  Substance and Sexual Activity  . Alcohol use: Yes    Comment: 1-2 glasses of wine a week  . Drug use: No  . Sexual activity: Not on file  Lifestyle  . Physical activity:    Days per week:  Not on file    Minutes per session: Not on file  . Stress: Not on file  Relationships  . Social connections:    Talks on phone: Not on file    Gets together: Not on file    Attends religious service: Not on file    Active member of club or organization: Not on file    Attends meetings of clubs or organizations: Not on file    Relationship status: Not on file  Other Topics Concern  . Not on file  Social History Narrative   Patient is retired . Patient works part time at [employer]. Patient has a Scientist, water quality.   Caffeine- One cup daily   Right handed.   Family History  Problem Relation Age of Onset  . Dementia Mother   . High blood pressure Mother   . Osteoarthritis Mother   . Arthritis Mother   . Heart disease Father   . Heart failure Father   . Glaucoma Brother        Review of Systems: Pertinent positive and negative review of systems were noted in the above HPI section. All other review of systems were otherwise negative.    Physical Exam: General: Well developed, well nourished, no acute distress Head: Normocephalic and atraumatic Eyes:  sclerae anicteric, EOMI Ears: Normal auditory acuity Mouth: No deformity or lesions Neck: Supple, no masses or thyromegaly Lungs: Clear throughout to auscultation Heart: Regular rate and rhythm; no murmurs, rubs or bruits Abdomen: Soft, non tender and non distended. No masses, hepatosplenomegaly or hernias noted. Normal Bowel sounds Rectal: deferred to colonoscopy Musculoskeletal: Symmetrical with no gross deformities  Skin: No lesions on visible extremities Pulses:  Normal pulses noted Extremities: No clubbing, cyanosis, edema or deformities noted Neurological: Alert oriented x 4, grossly nonfocal Cervical Nodes:  No significant cervical adenopathy Inguinal Nodes: No significant inguinal adenopathy Psychological:  Alert and cooperative. Normal mood and affect   Assessment and Recommendations:  1.  Chronic  constipation.  MiraLAX 1-3 scoops daily titrated for adequate bowel movements.  Continue Colace for now.  Reserve Dulcolax suppositories for refractory symptoms.  Previous trial of Linzess, efficacy unclear.  2.  Personal history of adenomatous colon polyps.  She is due for a 5-year interval surveillance colonoscopy.  Schedule colonoscopy. The risks (including bleeding, perforation, infection, missed lesions, medication reactions and possible hospitalization or surgery if complications occur), benefits, and alternatives to colonoscopy with possible biopsy and possible polypectomy were discussed with the patient and they consent to proceed.

## 2018-02-09 NOTE — Patient Instructions (Signed)
Continue Miralax 1-3 x daily.  You have been scheduled for a colonoscopy. Please follow written instructions given to you at your visit today.  Please pick up your prep supplies at the pharmacy within the next 1-3 days. If you use inhalers (even only as needed), please bring them with you on the day of your procedure. Your physician has requested that you go to www.startemmi.com and enter the access code given to you at your visit today. This web site gives a general overview about your procedure. However, you should still follow specific instructions given to you by our office regarding your preparation for the procedure.  Normal BMI (Body Mass Index- based on height and weight) is between 23 and 30. Your BMI today is Body mass index is 28.79 kg/m. Marland Kitchen Please consider follow up  regarding your BMI with your Primary Care Provider.  Thank you for choosing me and [facility].  Venita Lick. Pleas Koch., MD., Clementeen Graham

## 2018-02-16 ENCOUNTER — Ambulatory Visit (AMBULATORY_SURGERY_CENTER): Payer: Medicare Other | Admitting: Gastroenterology

## 2018-02-16 ENCOUNTER — Encounter: Payer: Self-pay | Admitting: Gastroenterology

## 2018-02-16 VITALS — BP 108/75 | HR 75 | Temp 97.8°F | Resp 17 | Ht 63.5 in | Wt 165.0 lb

## 2018-02-16 DIAGNOSIS — Z86010 Personal history of colonic polyps: Secondary | ICD-10-CM

## 2018-02-16 MED ORDER — SODIUM CHLORIDE 0.9 % IV SOLN: 500.0000 mL | INTRAVENOUS | Status: DC

## 2018-02-16 NOTE — Op Note (Signed)
[facility] Patient Name: Gail Mercado Procedure Date: 02/16/2018 2:14 PM MRN: [id number] Endoscopist: Meryl Dare , MD Age: 67 Referring MD:  Date of Birth: Apr 01, 1951 Gender: Female Account #: [id number] Procedure:                Colonoscopy Indications:              Surveillance: Personal history of adenomatous                            polyps on last colonoscopy 5 years ago Medicines:                Monitored Anesthesia Care Procedure:                Pre-Anesthesia Assessment:                           - Prior to the procedure, a History and Physical                            was performed, and patient medications and                            allergies were reviewed. The patient's tolerance of                            previous anesthesia was also reviewed. The risks                            and benefits of the procedure and the sedation                            options and risks were discussed with the patient.                            All questions were answered, and informed consent                            was obtained. Prior Anticoagulants: The patient has                            taken no previous anticoagulant or antiplatelet                            agents. ASA Grade Assessment: II - A patient with                            mild systemic disease. After reviewing the risks                            and benefits, the patient was deemed in                            satisfactory condition to undergo the procedure.  After obtaining informed consent, the colonoscope                            was passed under direct vision. Throughout the                            procedure, the patient's blood pressure, pulse, and                            oxygen saturations were monitored continuously. The                            Colonoscope was introduced through the anus and                            advanced to the the cecum,  identified by                            appendiceal orifice and ileocecal valve. The                            ileocecal valve, appendiceal orifice, and rectum                            were photographed. The quality of the bowel                            preparation was good. The colonoscopy was performed                            without difficulty. The patient tolerated the                            procedure well. Scope In: 2:30:20 PM Scope Out: 2:42:36 PM Scope Withdrawal Time: 0 hours 8 minutes 15 seconds  Total Procedure Duration: 0 hours 12 minutes 16 seconds  Findings:                 The perianal and digital rectal examinations were                            normal.                           Multiple medium-mouthed diverticula were found in                            the left colon. There was no evidence of                            diverticular bleeding.                           The exam was otherwise without abnormality on  direct and retroflexion views. Complications:            No immediate complications. Estimated blood loss:                            None. Estimated Blood Loss:     Estimated blood loss: none. Impression:               - Mild diverticulosis in the left colon. There was                            no evidence of diverticular bleeding.                           - The examination was otherwise normal on direct                            and retroflexion views.                           - No specimens collected. Recommendation:           - Repeat colonoscopy in 5 years for surveillance.                           - Patient has a contact number available for                            emergencies. The signs and symptoms of potential                            delayed complications were discussed with the                            patient. Return to normal activities tomorrow.                            Written discharge  instructions were provided to the                            patient.                           - High fiber diet.                           - Continue present medications. Meryl Dare, MD 02/16/2018 2:44:59 PM This report has been signed electronically.

## 2018-02-16 NOTE — Patient Instructions (Signed)
YOU HAD AN ENDOSCOPIC PROCEDURE TODAY AT THE [facility]:   Refer to the procedure report that was given to you for any specific questions about what was found during the examination.  If the procedure report does not answer your questions, please call your gastroenterologist to clarify.  If you requested that your care partner not be given the details of your procedure findings, then the procedure report has been included in a sealed envelope for you to review at your convenience later.  YOU SHOULD EXPECT: Some feelings of bloating in the abdomen. Passage of more gas than usual.  Walking can help get rid of the air that was put into your GI tract during the procedure and reduce the bloating. If you had a lower endoscopy (such as a colonoscopy or flexible sigmoidoscopy) you may notice spotting of blood in your stool or on the toilet paper. If you underwent a bowel prep for your procedure, you may not have a normal bowel movement for a few days.  Please Note:  You might notice some irritation and congestion in your nose or some drainage.  This is from the oxygen used during your procedure.  There is no need for concern and it should clear up in a day or so.  SYMPTOMS TO REPORT IMMEDIATELY:   Following lower endoscopy (colonoscopy or flexible sigmoidoscopy):  Excessive amounts of blood in the stool  Significant tenderness or worsening of abdominal pains  Swelling of the abdomen that is new, acute  Fever of 100F or higher  Please see handouts given to you on Diverticulosis.  For urgent or emergent issues, a gastroenterologist can be reached at any hour by calling [phone].   DIET:  We do recommend a small meal at first, but then you may proceed to your regular diet.  Drink plenty of fluids but you should avoid alcoholic beverages for 24 hours.  ACTIVITY:  You should plan to take it easy for the rest of today and you should NOT DRIVE or use heavy machinery until tomorrow  (because of the sedation medicines used during the test).    FOLLOW UP: Our staff will call the number listed on your records the next business day following your procedure to check on you and address any questions or concerns that you may have regarding the information given to you following your procedure. If we do not reach you, we will leave a message.  However, if you are feeling well and you are not experiencing any problems, there is no need to return our call.  We will assume that you have returned to your regular daily activities without incident.  If any biopsies were taken you will be contacted by phone or by letter within the next 1-3 weeks.  Please call us at [phone] if you have not heard about the biopsies in 3 weeks.    SIGNATURES/CONFIDENTIALITY: You and/or your care partner have signed paperwork which will be entered into your electronic medical record.  These signatures attest to the fact that that the information above on your After Visit Summary has been reviewed and is understood.  Full responsibility of the confidentiality of this discharge information lies with you and/or your care-partner.  Thank you for letting us take care of your healthcare needs today. 

## 2018-02-16 NOTE — Progress Notes (Signed)
Report to PACU, RN, vss, BBS= Clear.  

## 2018-02-17 ENCOUNTER — Telehealth: Payer: Self-pay

## 2018-02-17 ENCOUNTER — Other Ambulatory Visit: Payer: Self-pay | Admitting: Neurology

## 2018-02-17 MED ORDER — VENLAFAXINE HCL ER 150 MG PO CP24: 150.0000 mg | ORAL_CAPSULE | Freq: Every day | ORAL | 2 refills | Status: DC

## 2018-02-17 NOTE — Telephone Encounter (Signed)
  Follow up Call-  Call back number 02/16/2018  Post procedure Call Back phone  # [phone]  Permission to leave phone message Yes  Some recent data might be hidden     Patient questions:  Do you have a fever, pain , or abdominal swelling? No. Pain Score  0 *  Have you tolerated food without any problems? Yes.    Have you been able to return to your normal activities? Yes.    Do you have any questions about your discharge instructions: Diet   No. Medications  No. Follow up visit  No.  Do you have questions or concerns about your Care? No.  Actions: * If pain score is 4 or above: No action needed, pain <4.

## 2018-03-11 ENCOUNTER — Other Ambulatory Visit: Payer: Self-pay | Admitting: Neurology

## 2018-03-29 NOTE — Progress Notes (Signed)
Gail Mercado was seen today in the movement disorders clinic for neurologic consultation at the request of Dr. Venetia Maxon.  Pts PCP is [name], MD.  The consultation is for the evaluation of Parkinsons disease.  The records that were made available to me were reviewed.  First records I have from Dr. [name] are from 08/2012 but it is clear Gail Mercado was seen prior to that (pt states that Gail Mercado was seen since about 2012).  Pt states that first sx was R hand tremor and R hand clumsiness and that was in approximately 2011.  States that it seemed to start after a fall over a dishwasher but ortho didn't seem to think that it was all related to the shoulder.  Gail Mercado was started on requip (pt thinks in 2013) and changed to requip XL due to dizziness.   Over time this dosage has increased to 24 mg daily (been on this dose since 03/2014).  Gail Mercado was started on azilect in 2014 (pt states that Gail Mercado d/c this due to "interactions" with other meds).  Pt began to note inner anxiety and tried effexor without relief.  Gail Mercado was also noting severe tightness in the right side of the back.  carbidopa/levodopa 25/100 tid was added in 03/2016.  Pt states that within taking 2 pills Gail Mercado felt that "it was a miracle."  Her inner anxiety, writing, tremor, and back pain were all better.  Gail Mercado now takes carbidopa/levodopa 25/100 at 6am/1pm.    Specific Symptoms:  Tremor: Yes.  , only R hand but better now that on levodopa Family hx of similar:  No. Voice: no change Sleep: sleeping well  Vivid Dreams:  Yes.    Acting out dreams:  Yes.  , better now Wet Pillows: No. Postural symptoms:  Yes.  , better now that on carbidopa/levodopa  Falls?  Yes.  , last 6 months ago in the yard (fell in slick mud) Bradykinesia symptoms: was having trouble getting out of low car but better on meds Loss of smell:  No. Loss of taste:  No. Urinary Incontinence:  Yes.   (stress only, wearing pad) Difficulty Swallowing:  No. Handwriting, micrographia: Yes.   but  carbidopa/levodopa 25/100 improved it Trouble with ADL's:  No.  Trouble buttoning clothing: No. Depression:  No. (cutting back on effexor because once started levodopa Gail Mercado found that helped inner anxiety.  Only taking 2 pills per week) Memory changes:  No. Hallucinations:  No.  visual distortions: No. N/V:  No. Lightheaded:  No.  Syncope: No. Diplopia:  No. Dyskinesia:  No but states that foot taps a little but can stop it and Gail Mercado isn't sure that is dyskinesia  09/04/16 update:  Patient seen today in follow-up.  This patient is accompanied in the office by her spouse who supplements the history.  Gail Mercado is on Requip XL, 8 mg, 3 tablets per day.  Gail Mercado is also on carbidopa/levodopa 25/100, one tablet 3 times per day.  Gail Mercado denies any compulsive behaviors.  Gail Mercado denies sleep attacks.  Gail Mercado denies lightheadedness or near syncope.  No hallucinations.  No illusions or delusions.  I asked her to hold her medications before today's visit since I have never seen her off her medications.  Gail Mercado last took carbidopa/levodopa 25/100 yesterday at 1pm and requip XL yesterday night.  Pt feels stiff and Gail Mercado "aches" and feels tight.  Gail Mercado has started doing the biking at the [employer].    I also asked her to bring me her MRI  brain and Gail Mercado did that and I reviewed this.  It was dated 04/15/2012.  There was a rare T2 hyperintensity, but was otherwise normal.  12/24/16 update:  Patient seen in follow-up for her Parkinson's disease. Patient remains on ropinirole XL, 8 mg 3 times per day.  Gail Mercado is also on carbidopa/levodopa 25/100, one tablet 3 times per day.  About 1-2 times a week, Gail Mercado will take an extra 1/2 tablet during the day.   Overall, the patient feels that Gail Mercado has been stable since our last visit.  Gail Mercado has developed no new medical problems.  Gail Mercado denies any real falls but did sit in a chair that gave way and Gail Mercado fell because of that.  Gail Mercado denies any lightheadedness or near syncope.  Gail Mercado has had no hallucinations.  No visual  distortions.  Gail Mercado is doing biking at the [facility].  Gail Mercado is caregiving for her mother and has moved her closer to her, which has helped her stress.  Gail Mercado does c/o constipation.  Some trouble with sleep.  Her friend gave her 1/2 an [place] and it worked.  06/12/17 update: Patient is seen today in follow-up for Parkinson's disease.  Gail Mercado is on ropinirole XL, 8 mg 3 at bedtime.  I increased her carbidopa/levodopa 25/100 to qid (6am/10am/2pm/6pm) dosing since our last visit.  Gail Mercado feels that it may be running out about 30 min before it is due to take the next pill.  Hands feel stiff but wonders if that is maybe arthritis.  Gail Mercado has sent me multiple emails since our last visit.  Pt denies falls.  Pt denies lightheadedness, near syncope.  No hallucinations.  Mood has been pretty good on effexor XR 75 mg daily.  Her mother did die in November - pt had been taking care of her for 9 years and pt is having trouble finding a new normal.  Gail Mercado is still going to [facility] cycle class.  Her yoga class was cancelled.  Gained weight with winter and stress of mom dying.   Gail Mercado did go to her PCP about shoulder pain after I told her that it wasn't a PD sx and was given prednisone and that helped.    10/14/17 update: Patient is seen today for follow-up of Parkinson's disease.  Gail Mercado is on Requip XL, 24 mg at nighttime.  Gail Mercado has had no hallucinations or compulsive behavior.  Gail Mercado is on carbidopa/levodopa 25/100, 1 tablet 4 times per day (rarely 5 times) and last visit entacapone was added to those dosages.  Gail Mercado reported that this made her feel worse and Gail Mercado ultimately discontinued the medication after a short time trying it.  Gail Mercado is due for a colonoscopy and is scheduled for an appt with Dr. Russella Dar in July. Gail Mercado is dealing with constipation.  Trying to drink "more water" but drinking about 4 bottles per day.  Taking colace bid.  Trouble with sleep and "can you give me something for it."  Mostly trouble staying asleep.  One episode of sleeping with girlfriend  and in sleep sat straight up in bed.  Was yelling out but thinks that is better.   Mood has been good.  On low dose effexor, 75 mg.  Trying to settle her moms estate.  03/31/18 update: Patient is seen today in follow-up for Parkinson's disease.  Patient is on Requip XL, 8 mg, 3 tablets at bedtime.  No compulsive behaviors.  No hallucinations.  No sleep attacks.  Gail Mercado is also on carbidopa/levodopa 25/100, 1 tablet 4 times per  day.  Gail Mercado was taking 5 for a while when renovating her house but back to 4 per day.  No lightheadedness or near syncope[place]  With the other Gail Mercado tripped over a lawnmower.   Patient emailed me since last visit regarding depression.  Her Effexor XR was increased to 150 mg daily in early October.  Gail Mercado states that this dramatically helped and Gail Mercado is feeling much better.  Gail Mercado did have her B12 level checked at her primary care office since her last visit.  It was low at 176.  Supplementation orally was recommended, 1000 mcg daily. Gail Mercado is doing this. 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  Gail Mercado reports that Gail Mercado is only taking it as needed but when Gail Mercado takes it Gail Mercado takes a full tablet as another patient told her to do it that way.  I have reviewed her records since her last visit.  Gail Mercado saw Dr. Russella Dar in regards to her constipation.  MiraLAX was recommended as well as a colonoscopy.  Colonoscopy was unremarkable with the exception of mild diverticulosis.  Gail Mercado did have pneumonia last month and is just getting over it (voice is still hoarse)  PREVIOUS MEDICATIONS: Requip and carbidopa/levodopa; entacapone (felt made her worse and d/c after few days)  ALLERGIES:   Allergies  Allergen Reactions  . Ciprofloxacin     REACTION: unknown  . Penicillins     REACTION: hives  . Wasp Venom Swelling    CURRENT MEDICATIONS:  Outpatient Encounter Medications as of 03/31/2018  Medication Sig  .  aspirin-acetaminophen-caffeine (EXCEDRIN MIGRAINE) 250-250-65 MG per tablet Take 1 tablet by mouth every 6 (six) hours as needed for pain.  . Calcium Citrate-Vitamin D (CALCIUM + D PO) Take 1 tablet by mouth daily.  . carbidopa-levodopa (SINEMET IR) 25-100 MG tablet TAKE 1 TABLET BY MOUTH FOUR TIMES A DAY  . cholecalciferol (VITAMIN D) 1000 units tablet Take 1,000 Units by mouth daily.  . clonazePAM (KLONOPIN) 0.5 MG tablet Take 0.5 tablets (0.25 mg total) by mouth at bedtime. (Patient taking differently: Take 0.5 mg by mouth at bedtime as needed. )  . hydrochlorothiazide (HYDRODIURIL) 25 MG tablet Take 25 mg by mouth daily.  Marland Kitchen levothyroxine (SYNTHROID, LEVOTHROID) 25 MCG tablet Take 25 mcg by mouth daily.  . mometasone-formoterol (DULERA) 100-5 MCG/ACT AERO Inhale 1 puff into the lungs 2 (two) times daily.  . polyethylene glycol (MIRALAX / GLYCOLAX) packet Take 17 g by mouth daily as needed.   . Probiotic Product (PROBIOTIC-10 PO) Take by mouth.  Marland Kitchen rOPINIRole (REQUIP XL) 8 MG 24 hr tablet TAKE 3 TABLETS (24 MG TOTAL) BY MOUTH AT BEDTIME.  Marland Kitchen venlafaxine XR (EFFEXOR-XR) 150 MG 24 hr capsule Take 1 capsule (150 mg total) by mouth daily with breakfast.  . vitamin B-12 (CYANOCOBALAMIN) 1000 MCG tablet Take 1,000 mcg by mouth daily.  . [DISCONTINUED] venlafaxine XR (EFFEXOR-XR) 150 MG 24 hr capsule TAKE 1 CAPSULE (150 MG TOTAL) BY MOUTH DAILY WITH BREAKFAST.  . [DISCONTINUED] docusate sodium (COLACE) 100 MG capsule Take 200 mg by mouth 2 (two) times daily.   . [DISCONTINUED] rOPINIRole (REQUIP XL) 8 MG 24 hr tablet TAKE 3 TABLETS (24 MG TOTAL) BY MOUTH AT BEDTIME.   No facility-administered encounter medications on file as of 03/31/2018.     PAST MEDICAL HISTORY:   Past Medical History:  Diagnosis Date  . Anxiety   . Asthma   .  Back pain   . Bronchiectasis   . Depression   . Hypertension   . Neuromuscular disorder (HCC)    parkinson's disease  . Parkinson disease (HCC)   . Thyroid disease      PAST SURGICAL HISTORY:   Past Surgical History:  Procedure Laterality Date  . back surgery in 1996 & 2005    . breast biopsy 1976    . CATARACT EXTRACTION, BILATERAL  05/31/2015, 06-21-15  . TOE SURGERY Left 01/09/2015    SOCIAL HISTORY:   Social History   Socioeconomic History  . Marital status: Married    Spouse name: Onalee HuaDavid  . Number of children: 1  . Years of education: masters  . Highest education level: Not on file  Occupational History  . 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 Financial resource strain: Not on file  . Food insecurity:    Worry: Not on file    Inability: Not on file  . Transportation needs:    Medical: Not on file    Non-medical: Not on file  Tobacco Use  . Smoking status: Former Smoker    Packs/day: 0.30    Years: 10.00    Pack years: 3.00    Types: Cigarettes    Last attempt to quit: 05/12/1978    Years since quitting: 39.9  . Smokeless tobacco: Never Used  Substance and Sexual Activity  . Alcohol use: Yes    Comment: 1-2 glasses of wine a week  . Drug use: No  . Sexual activity: Not on file  Lifestyle  . Physical activity:    Days per week: Not on file    Minutes per session: Not on file  . Stress: Not on file  Relationships  . Social connections:    Talks on phone: Not on file    Gets together: Not on file    Attends religious service: Not on file    Active member of club or organization: Not on file    Attends meetings of clubs or organizations: Not on file    Relationship status: Not on file  . Intimate partner violence:    Fear of current or ex partner: Not on file    Emotionally abused: Not on file    Physically abused: Not on file    Forced sexual activity: Not on file  Other Topics Concern  . Not on file  Social History Narrative   Patient is retired . Patient works part time at [employer]. Patient has a Scientist, [employer].   Caffeine- One cup daily   Right handed.     FAMILY HISTORY:   Family Status  Relation Name Status  . Mother  Deceased  . Father  Deceased  . Brother  Alive  . Son  Alive    ROS:  Review of Systems  Constitutional: Negative.   HENT: Negative.   Eyes: Negative.   Cardiovascular: Negative.   Gastrointestinal: Negative.   Genitourinary: Negative.   Skin: Negative.   Endo/Heme/Allergies: Negative.     PHYSICAL EXAMINATION:    VITALS:   Vitals:   03/31/18 0820  BP: 138/86  Pulse: 96  SpO2: 94%  Weight: 169 lb (76.7 kg)  Height: 5' 3.5" (1.613 m)   Wt Readings from Last 3 Encounters:  03/31/18 169 lb (76.7 kg)  02/16/18 165 lb (74.8 kg)  02/09/18 165 lb 2 oz (74.9 kg)   GEN:  The patient appears stated  age and is in NAD. HEENT:  Normocephalic, atraumatic.  The mucous membranes are moist. The superficial temporal arteries are without ropiness or tenderness. CV:  RRR Lungs:  CTAB Neck/HEME:  There are no carotid bruits bilaterally.  Neurological examination:  Orientation: The patient is alert and oriented x3. Cranial nerves: There is good facial symmetry. The speech is fluent but Gail Mercado is hoarse (had cough/pneumonia recently). Soft palate rises symmetrically and there is no tongue deviation. Hearing is intact to conversational tone. Sensation: Sensation is intact to light touch throughout Motor: Strength is 5/5 in the bilateral upper and lower extremities.   Shoulder shrug is equal and symmetric.  There is no pronator drift.  Movement examination: Tone: There is mild increased tone in the RUE Abnormal movements: none Coordination:  There is no decremation with RAM's, with any form of RAMS, including alternating supination and pronation of the forearm, hand opening and closing, finger taps, heel taps and toe taps. Gait and Station: The patient has no difficulty arising out of a deep-seated chair without the use of the hands. The patient's stride length is normal with good arm swing.     ASSESSMENT/PLAN:  1.   Idiopathic Parkinson's disease.  The patient has tremor, bradykinesia, rigidity and mild postural instability.  Dx in 2012 per pt.  -We discussed the diagnosis as well as pathophysiology of the disease.  We discussed treatment options as well as prognostic indicators.  Patient education was provided.  -Gail Mercado will continue carbidopa/levodopa 25/100, 1 po qid.  Can take an extra prn.    -currently on requip xl, 8mg , 3 at night.  Told her to change to requip, 8 mg, 1 in the AM, 2 at night.  -get back to exercise once physical sx's from pneumonia all resolved (mostly resolved now.  Just hoarse voice)  -safety discussed.  PD education given  2.  Parkinsons dyskinesia  -overall mild.  Pt doesn't notice it so won't treat now  3.  Constipation  -reinforced water intake  -on probiotic  -better with miralax  -seeing GI  4.  Insomnia with occasional RBD  -melatonin not helping  -on Clonazepam but only prn.    5.  depression  -doing better with effexor XR 150 mg daily.  Risks, benefits, side effects and alternative therapies were discussed.  The opportunity to ask questions was given and they were answered to the best of my ability.  The patient expressed understanding and willingness to follow the outlined treatment protocols.  6.  Follow up is anticipated in the next few months, sooner should new neurologic issues arise.  Much greater than 50% of this visit was spent in counseling and coordinating care.  Total face to face time:  25 min    Cc:  [name], MD

## 2018-03-31 ENCOUNTER — Encounter: Payer: Self-pay | Admitting: Neurology

## 2018-03-31 ENCOUNTER — Ambulatory Visit: Payer: Medicare Other | Admitting: Neurology

## 2018-03-31 VITALS — BP 138/86 | HR 96 | Ht 63.5 in | Wt 169.0 lb

## 2018-03-31 DIAGNOSIS — F330 Major depressive disorder, recurrent, mild: Secondary | ICD-10-CM | POA: Diagnosis not present

## 2018-03-31 DIAGNOSIS — G20 Parkinson's disease: Secondary | ICD-10-CM | POA: Diagnosis not present

## 2018-03-31 DIAGNOSIS — G4701 Insomnia due to medical condition: Secondary | ICD-10-CM | POA: Diagnosis not present

## 2018-03-31 MED ORDER — VENLAFAXINE HCL ER 150 MG PO CP24: 150.0000 mg | ORAL_CAPSULE | Freq: Every day | ORAL | 1 refills | Status: DC

## 2018-03-31 NOTE — Patient Instructions (Signed)
change requip, 8 mg, 1 in the AM, 2 at night as opposed to taking all 3 at night

## 2018-04-01 MED ORDER — CLONAZEPAM 0.5 MG PO TABS: 0.5000 mg | ORAL_TABLET | Freq: Every evening | ORAL | 1 refills | Status: DC | PRN

## 2018-04-01 NOTE — Telephone Encounter (Signed)
Dr. Arbutus Leasat - please sign RX.

## 2018-04-28 ENCOUNTER — Other Ambulatory Visit: Payer: Self-pay | Admitting: Neurology

## 2018-05-10 ENCOUNTER — Other Ambulatory Visit: Payer: Self-pay | Admitting: Neurology

## 2018-05-10 MED ORDER — VENLAFAXINE HCL ER 150 MG PO CP24: 150.0000 mg | ORAL_CAPSULE | Freq: Every day | ORAL | 1 refills | Status: DC

## 2018-06-07 ENCOUNTER — Other Ambulatory Visit: Payer: Self-pay | Admitting: Neurology

## 2018-06-08 ENCOUNTER — Other Ambulatory Visit: Payer: Self-pay | Admitting: Neurology

## 2018-06-08 MED ORDER — CARBIDOPA-LEVODOPA 25-100 MG PO TABS: 1.0000 | ORAL_TABLET | Freq: Four times a day (QID) | ORAL | 1 refills | Status: DC

## 2018-08-06 ENCOUNTER — Other Ambulatory Visit: Payer: Self-pay | Admitting: Neurology

## 2018-08-06 NOTE — Telephone Encounter (Signed)
I'm a little confused. Should she be on XL or regular Requip?

## 2018-08-19 NOTE — Progress Notes (Deleted)
Virtual Visit via Video Note The purpose of this virtual visit is to provide medical care while limiting exposure to the novel coronavirus.    Consent was obtained for video visit:  {yes no:314532} Answered questions that patient had about telehealth interaction:  {yes no:314532} I discussed the limitations, risks, security and privacy concerns of performing an evaluation and management service by telemedicine. I also discussed with the patient that there may be a patient responsible charge related to this service. The patient expressed understanding and agreed to proceed.  Pt location: Home Physician Location: office Name of referring provider:  Juluis RainierBarnes, Elizabeth, MD I connected with Gail Mercado A Alberson at patients initiation/request on 08/23/2018 at 11:00 AM EDT by video enabled telemedicine application and verified that I am speaking with the correct person using two identifiers. Pt MRN:  [id number] Pt DOB:  09-19-1950 Video Participants:  Gail Mercado A Shives;  ***   History of Present Illness: *** Patient seen today in follow-up for Parkinson's disease.  She is on carbidopa/levodopa 25/100, 1 tablet 4 times per day.  She is also on Requip XL, 8 mg, 1 tablet in the morning and 2 tablets at night (split this up after last visit).  Pt denies falls.  Pt denies lightheadedness, near syncope.  No hallucinations.  Mood has been good on Effexor XR, 150 mg daily.  Reports that she is taking clonazepam as needed.  She takes it about ***.   Observations/Objective:   There were no vitals filed for this visit. GEN:  The patient appears stated age and is in NAD.  Neurological examination:  Orientation: The patient is alert and oriented x3. Cranial nerves: There is good facial symmetry. There is ***facial hypomimia.  The speech is fluent and clear. Soft palate rises symmetrically and there is no tongue deviation. Hearing is intact to conversational tone. Motor: Strength is at least antigravity x 4.   Shoulder  shrug is equal and symmetric.  There is no pronator drift.  Movement examination: Tone: unable Abnormal movements: ***None Coordination:  There is ***no decremation with RAM's, *** Gait and Station: The patient has ***none difficulty arising out of a deep-seated chair without the use of the hands. The patient's stride length is ***.      Assessment and Plan:   1.  Idiopathic Parkinson's disease.  The patient has tremor, bradykinesia, rigidity and mild postural instability.  Dx in 2012 per pt.             -We discussed the diagnosis as well as pathophysiology of the disease.  We discussed treatment options as well as prognostic indicators.  Patient education was provided.             -she will continue carbidopa/levodopa 25/100, 1 po qid.  Can take an extra prn.               -*** requip xl, , 8 mg, 1 in the AM, 2 at night.             -get back to exercise once physical sx's from pneumonia all resolved (mostly resolved now.  Just hoarse voice)             -safety discussed.  PD education given  2.  Parkinsons dyskinesia             -overall mild.  Pt doesn't notice it so won't treat now  3.  Constipation             -reinforced water intake             -  on probiotic             -better with miralax             -seeing GI  4.  Insomnia with occasional RBD             -melatonin not helping             -on Clonazepam but only prn.    5.  depression             -doing better with effexor XR 150 mg daily.  Risks, benefits, side effects and alternative therapies were discussed.  The opportunity to ask questions was given and they were answered to the best of my ability.  The patient expressed understanding and willingness to follow the outlined treatment protocols.  Follow Up Instructions:    -I discussed the assessment and treatment plan with the patient. The patient was provided an opportunity to ask questions and all were answered. The patient agreed with the plan and  demonstrated an understanding of the instructions.   The patient was advised to call back or seek an in-person evaluation if the symptoms worsen or if the condition fails to improve as anticipated.    Total Time spent in visit with the patient was:  ***, of which more than 50% of the time was spent in counseling and/or coordinating care on ***.   Pt understands and agrees with the plan of care outlined.     Kerin Salen, DO

## 2018-08-23 ENCOUNTER — Telehealth: Payer: Medicare Other | Admitting: Neurology

## 2018-08-27 NOTE — Progress Notes (Signed)
Virtual Visit via Video Note The purpose of this virtual visit is to provide medical care while limiting exposure to the novel coronavirus.    Consent was obtained for video visit:  Yes.   Answered questions that patient had about telehealth interaction:  Yes.   I discussed the limitations, risks, security and privacy concerns of performing an evaluation and management service by telemedicine. I also discussed with the patient that there may be a patient responsible charge related to this service. The patient expressed understanding and agreed to proceed.  Pt location: Home Physician Location: office Name of referring provider:  Juluis RainierBarnes, Elizabeth, MD I connected with Gail Schneidersita A Headings at patients initiation/request on 08/30/2018 at  1:00 PM EDT by video enabled telemedicine application and verified that I am speaking with the correct person using two identifiers. Pt MRN:  [id number] Pt DOB:  1951-03-13 Video Participants:  Gail Mercado;     History of Present Illness:  Patient seen today in follow-up for Parkinson's disease.  She was on carbidopa/levodopa 25/100, 1 tablet 4 times per day but states that she has increased that to 5 times per day some days (not all days).  She states that if her back feels painful and tight she will take an extra.  She thinks that it may help but has a long hx of back issues.  Her back hurts a lot at night.  She is also on Requip XL, 8 mg, 1 tablet in the morning and 2 tablets at night (split this up after last visit).  She didn't notice a change with splitting up the medication.  She actually was down to 2 pills a day for 10 days b/c of pharmacy ran out and had to decrease the medication then.  She didn't notice any difference then.  She had one fall in the garage.  She tripped over stuff in the garage last week.  Pt denies lightheadedness, near syncope.  No hallucinations.  Mood has been good on Effexor XR, 150 mg daily.  Reports that she is taking clonazepam as  needed.  She takes it about 50% of the nights.  She has some dyskinesia but doesn't bother her.    She is having leg and hand cramps - "more than ever before."  "I tried entacapone and I didn't like it and I didn't think that it helped that much."   PREVIOUS MEDICATIONS: Requip and carbidopa/levodopa; entacapone (felt made her worse and d/c after few days)  Observations/Objective:   There were no vitals filed for this visit. GEN:  The patient appears stated age and is in NAD.  Neurological examination:  Orientation: The patient is alert and oriented x3. Cranial nerves: There is good facial symmetry. There is no facial hypomimia.  The speech is fluent and clear. Soft palate rises symmetrically and there is no tongue deviation. Hearing is intact to conversational tone. Motor: Strength is at least antigravity x 4.   Shoulder shrug is equal and symmetric.  There is no pronator drift.  Movement examination: Tone: unable Abnormal movements: she has a moderate amount of axial dyskinesia. Coordination:  There is no decremation with RAM's, with hand opening and closing, finger taps or alternation of supination/pronation of the forearm bilaterally.  Unable to see feet to assess them Gait and Station: The patient has no difficulty arising out of a deep-seated chair without the use of the hands. The patient's stride length is good.      Assessment and Plan:   1.  Idiopathic Parkinson's disease.  The patient has tremor, bradykinesia, rigidity and mild postural instability.  Dx in 2012 per pt.             -We discussed the diagnosis as well as pathophysiology of the disease.  We discussed treatment options as well as prognostic indicators.  Patient education was provided.             -she will continue the carbidopa/levodopa 25/100, 1 tablet 4-5 times per day.             -continue requip xl, , 8 mg, 1 in the AM, 2 at night.  -she had mod dyskinesia but didn't notice much of it and isn't bothersome.   Can consider amantadine in the future  -having cramping in hands and feet.  She is to take note the timing of this so I can see if this is a wearing off phenomenon.  She has already failed comtan             -Discussed in detail Covid-19 and risk factors for this, including age and PD.  Discussed importance of social distancing.  Discussed importance of staying home at all times, as is feasible.  Discussed taking advantage of grocery store hours for the elderly.  Pt expressed understanding.  2.  Parkinsons dyskinesia             -as above.  May consider amantadine  3.  Constipation             -Has seen Dr. Russella Dar.  Doing better with daily MiraLAX.  4.  Insomnia with occasional RBD             -melatonin not helping             -on Clonazepam but only prn.    Takes it about 50% of the nights.  5.  depression             -doing better with effexor XR 150 mg daily.  Risks, benefits, side effects and alternative therapies were discussed.  The opportunity to ask questions was given and they were answered to the best of my ability.  The patient expressed understanding and willingness to follow the outlined treatment protocols.  6.  Low back pain  -likely due to chronic issues.  Pt has a history of chronic low back pain and has seen Dr. Venetia Maxon in the past as well as Dr. Ollen Bowl.  She asks for referral back.  Will refer to Dr. Venetia Maxon.    Follow Up Instructions:    -I discussed the assessment and treatment plan with the patient. The patient was provided an opportunity to ask questions and all were answered. The patient agreed with the plan and demonstrated an understanding of the instructions.   The patient was advised to call back or seek an in-person evaluation if the symptoms worsen or if the condition fails to improve as anticipated.    Total Time spent in visit with the patient was:  25 min, of which more than 50% of the time was spent in counseling and/or coordinating care on safety (this  didn't include the additional 15 min with her on the phone trying to get her hooked into the visit).   Pt understands and agrees with the plan of care outlined.     Kerin Salen, DO

## 2018-08-30 ENCOUNTER — Encounter: Payer: Self-pay | Admitting: *Deleted

## 2018-08-30 ENCOUNTER — Telehealth (INDEPENDENT_AMBULATORY_CARE_PROVIDER_SITE_OTHER): Payer: Medicare Other | Admitting: Neurology

## 2018-08-30 ENCOUNTER — Encounter: Payer: Self-pay | Admitting: Neurology

## 2018-08-30 ENCOUNTER — Other Ambulatory Visit: Payer: Self-pay

## 2018-08-30 DIAGNOSIS — G20 Parkinson's disease: Secondary | ICD-10-CM | POA: Diagnosis not present

## 2018-08-30 DIAGNOSIS — M545 Low back pain: Secondary | ICD-10-CM

## 2018-08-30 DIAGNOSIS — K5901 Slow transit constipation: Secondary | ICD-10-CM | POA: Diagnosis not present

## 2018-08-30 DIAGNOSIS — G249 Dystonia, unspecified: Secondary | ICD-10-CM | POA: Diagnosis not present

## 2018-08-30 DIAGNOSIS — G8929 Other chronic pain: Secondary | ICD-10-CM

## 2018-09-01 ENCOUNTER — Ambulatory Visit: Payer: Medicare Other | Admitting: Neurology

## 2018-10-12 NOTE — Telephone Encounter (Signed)
Called Dr. Venetia Maxon office spoke with Zollie Scale Dr. Venetia Maxon MA she states that their was a referral sent but was filed and never sent to her to scheduled the patient. No referral needed. She will the patient to schedule appt.   Called patient to inform her of this no answer left message on voice mail.

## 2018-10-14 NOTE — Telephone Encounter (Signed)
Receive hard fax from [facility]  Patient schedule with Dr. Venetia Maxon on 10/27/18 @9 :45am  Provider aware

## 2018-11-18 DIAGNOSIS — G20 Parkinson's disease: Secondary | ICD-10-CM

## 2018-11-19 NOTE — Telephone Encounter (Signed)
Referral place for PT at neuro rehab today

## 2018-12-13 ENCOUNTER — Other Ambulatory Visit: Payer: Self-pay

## 2018-12-13 ENCOUNTER — Encounter: Payer: Self-pay | Admitting: Physical Therapy

## 2018-12-13 ENCOUNTER — Ambulatory Visit: Payer: Medicare Other | Attending: Neurology | Admitting: Physical Therapy

## 2018-12-13 DIAGNOSIS — M545 Low back pain: Secondary | ICD-10-CM | POA: Diagnosis present

## 2018-12-13 DIAGNOSIS — R2689 Other abnormalities of gait and mobility: Secondary | ICD-10-CM | POA: Insufficient documentation

## 2018-12-13 DIAGNOSIS — G8929 Other chronic pain: Secondary | ICD-10-CM

## 2018-12-13 DIAGNOSIS — R293 Abnormal posture: Secondary | ICD-10-CM | POA: Diagnosis present

## 2018-12-13 NOTE — Therapy (Signed)
[facility] [address] [place], [place], [address] Phone: [phone]   Fax:  [phone]  Physical Therapy Evaluation  Patient Details  Name: Gail Mercado MRN: [id number] Date of Birth: [birth year] Referring Provider (PT): Tat, Lurena Joinerebecca    Encounter Date: 12/13/2018  CLINIC OPERATION CHANGES: Outpatient Neuro Rehab is open at lower capacity following universal masking, social distancing, and patient screening.  The patient's COVID risk of complications score is 2.   PT End of Session - 12/13/18 1425    Visit Number  1    Number of Visits  9    Date for PT Re-Evaluation  02/11/19    Authorization Type  [facility] [employer] 10th visit progress noted required    PT Start Time  1318    PT Stop Time  1402    PT Time Calculation (min)  44 min    Activity Tolerance  Patient tolerated treatment well    Behavior During Therapy  WFL for tasks assessed/performed;Restless   increased truncal and lower extremity dyskinesias noted during seated portion of eval      Past Medical History:  Diagnosis Date  . Anxiety   . Asthma   . Back pain   . Bronchiectasis   . Depression   . Hypertension   . Neuromuscular disorder (HCC)    parkinson's disease  . Parkinson disease (HCC)   . Thyroid disease     Past Surgical History:  Procedure Laterality Date  . back surgery in 1996 & 2005    . breast biopsy 1976    . CATARACT EXTRACTION, BILATERAL  05/31/2015, 06-21-15  . TOE SURGERY Left 01/09/2015    There were no vitals filed for this visit.   Subjective Assessment - 12/13/18 1321    Subjective  Pt reports being diagnosed with Parkinson's disease in 2014.  Has had adjustments in medications throughout (when first on Carbidopa/Levodopa, like I had a new life).  For the past 1-2 years, when I try to walk for exercise, the back has been an underlying issue, with it feeling like it "tightens" when I walk.  [facility], which helped me more than I realized (now that we can't do it due to COVID-19).  Had a signfican fall in the fall 2019, where pt walked into concrete bench, and now her L shin just below knee is painful, limiting walking and step negotiation.    Pertinent History  asthma, hx of back surgeries    Patient Stated Goals  Pt's goal for therapy is to improve mobility and get rid of pain.    Currently in Pain?  Yes   No back pain during eval; back pain at night.   Pain Score  2     Pain Location  Leg   just below knee   Pain Orientation  Left    Pain Descriptors / Indicators  Aching    Pain Type  Chronic pain    Pain Onset  More than a month ago    Pain Frequency  Intermittent    Aggravating Factors   walking, stairs    Pain Relieving Factors  elevating foot         [facility] PT Assessment - 12/13/18 1413      Assessment   Medical Diagnosis  Parkinson's disease    Referring Provider (PT)  Kerin Salenat, Rebecca     Onset Date/Surgical Date  11/19/18   MD order     Precautions  Precautions  Fall      Balance Screen   Has the patient fallen in the past 6 months  No   Pt had one fall in the fall of 2019, no falls since   Has the patient had a decrease in activity level because of a fear of falling?   No    Is the patient reluctant to leave their home because of a fear of falling?   No      Prior Function   Level of Independence  Independent      Observation/Other Assessments   Focus on Therapeutic Outcomes (FOTO)   NA    Other Surveys   Oswestry Disability Index    Oswestry Disability Index   32% disability (Section 3 lifting:  3, section 4 walking:  2, section 5 sitting:  2; section 6 standing 1; section 7 sleeping 4; section 9 social life 2; section 10 traveling:  2)      Posture/Postural Control   Posture/Postural Control  Postural limitations    Postural Limitations  Rounded Shoulders    Posture Comments  Dyskinesias noted through trunk and lower extremities during seated portion of  PT eval      ROM / Strength   AROM / PROM / Strength  Strength      Strength   Overall Strength  Within functional limits for tasks performed    Overall Strength Comments  LLE strength WFL, no pain with resistance during MMT      Palpation   Palpation comment  Pt tender to palpation just distal to L knee, with slight edema noted      Transfers   Transfers  Sit to Stand;Stand to Sit    Sit to Stand  6: Modified independent (Device/Increase time);With upper extremity assist;From chair/3-in-1    Stand to Sit  6: Modified independent (Device/Increase time);With upper extremity assist;To chair/3-in-1      Ambulation/Gait   Ambulation/Gait  Yes    Ambulation/Gait Assistance  6: Modified independent (Device/Increase time)    Ambulation Distance (Feet)  100 Feet    Assistive device  None    Gait Pattern  Step-through pattern;Narrow base of support    Ambulation Surface  Level;Indoor    Gait velocity  9.12 sec = 3.6 ft/sec    Stairs  Yes    Stairs Assistance  6: Modified independent (Device/Increase time)    Stair Management Technique  Two rails;Forwards;Alternating pattern;Step to pattern   alternating pattern ascending; step-to pattern descending   Number of Stairs  4    Height of Stairs  6    Gait Comments  Pt c/o increased pain in L shin/distal to L knee after short distance walking in eval      Standardized Balance Assessment   Standardized Balance Assessment  Timed Up and Go Test      Timed Up and Go Test   TUG  Normal TUG    Normal TUG (seconds)  9.58    TUG Comments  Scores >13.5 sec indicate increased fall risk                Objective measurements completed on examination: See above findings.              PT Education - 12/13/18 1423    Education Details  Discussed relation of Parkinson's disease with stiffness/rigidity/pain; discussed rationale of Carbidopa-Levodopa and benefits of journaling medication timing with symptoms to discuss with Dr. Julaine Huaat     Person(s) Educated  Patient  Methods  Explanation    Comprehension  Verbalized understanding          PT Long Term Goals - 12/13/18 1438      PT LONG TERM GOAL #1   Title  Pt will be independent with HEP for improved flexibility, mobility, decreased pain.  TARGET 01/21/2019 (due to delayed start due to scheduling)    Time  5    Period  Weeks    Status  New    Target Date  01/21/19      PT LONG TERM GOAL #2   Title  Pt will verbalize/demo understanding of walking program for exercise, with no greater than 1-2 point increase in pain with ambulation.    Time  5    Period  Weeks    Status  New    Target Date  01/21/19      PT LONG TERM GOAL #3   Title  Pt will decrease Oswestry Pain score to less than or equal to 25% disability due to back pain, for improved functional mobility.    Time  5    Period  Weeks    Status  New    Target Date  01/21/19      PT LONG TERM GOAL #4   Title  FGA to be assessed, with goal to be written as appropriate, to address balance.    Time  5    Period  Weeks    Status  New    Target Date  01/21/19      PT LONG TERM GOAL #5   Title  Pt will verbalize understanding of local Parkinson's disease resources for exercise and optimal disease management.    Time  5    Period  Weeks    Status  New    Target Date  01/21/19             Plan - 12/13/18 1426    Clinical Impression Statement  Pt is a 68 year old female with history of Parkinson's disease since 2014, with pt reported increasing back pain and decreased exercise ability over the past one year.  She presents with dyskinesias, abnormal posture, hx of back pain and leg pain, history of one fall; balance not able to be fully assessed, as pt's main c/o are back and leg pain and pt has many questions regarding PD medications during eval today.  Pt's pain is limiting sleeping (<2 hours per night) and walking, lifting.  Prior to COVID-19, pt was exercising regularly in PD cycle class, but has  been unable to do so since March 2020.  Pt would benefit from skilled PT to address the deficits stated above to improve pt's overall functional mobility and quality of life.    Personal Factors and Comorbidities  Comorbidity 3+    Comorbidities  anxiety, depression, hx of 2 back surgeries    Examination-Activity Limitations  Sleep;Locomotion Level    Examination-Participation Restrictions  Other   caring for 75 yo grandchildren   Stability/Clinical Decision Making  Stable/Uncomplicated    Clinical Decision Making  Low    Rehab Potential  Good    PT Frequency  2x / week   2x/wk for 4 weeks   PT Duration  Other (comment)   plus eval, total POC = 5 weeks   PT Treatment/Interventions  ADLs/Self Care Home Management;Gait training;Stair training;Functional mobility training;Therapeutic activities;Therapeutic exercise;Balance training;Neuromuscular re-education;Patient/family education;Manual techniques;Moist Heat;Electrical Stimulation    PT Next Visit Plan  Check either FGA or  MiniBESTest and add to goals if appropriate; PWR! Moves in sitting, supine, standing; discussed sleeping positioning    Consulted and Agree with Plan of Care  Patient       Patient will benefit from skilled therapeutic intervention in order to improve the following deficits and impairments:  Abnormal gait, Decreased mobility, Postural dysfunction  Visit Diagnosis: 1. Other abnormalities of gait and mobility   2. Chronic bilateral low back pain without sciatica   3. Abnormal posture        Problem List Patient Active Problem List   Diagnosis Date Noted  . Low back pain 09/07/2013  . Right lumbar radiculopathy 09/07/2013  . Hypertension   . Anxiety   . Parkinson disease (HCC)   . Unspecified essential hypertension 06/30/2012  . Parkinson's disease (tremor, stiffness, slow motion, unstable posture) (HCC) 06/30/2012  . PULMONARY NODULE 04/24/2010  . ASTHMA 11/20/2009  . BRONCHIECTASIS W/O ACUTE EXACERBATION  11/20/2009  . DEPRESSION/ANXIETY 11/19/2009    Aarush Stukey W. 12/13/2018, 3:40 PM  Gean MaidensMARRIOTT,Boneta Standre W., PT    [facility] [address] [place], [place], [address] Phone: [phone]   Fax:  [phone]  Name: Gail SchneidersRita A Mercado MRN: [id number] Date of Birth: 1950-12-24

## 2018-12-22 ENCOUNTER — Ambulatory Visit (INDEPENDENT_AMBULATORY_CARE_PROVIDER_SITE_OTHER): Payer: Medicare Other | Admitting: Pulmonary Disease

## 2018-12-22 ENCOUNTER — Other Ambulatory Visit: Payer: Self-pay

## 2018-12-22 ENCOUNTER — Encounter: Payer: Self-pay | Admitting: Pulmonary Disease

## 2018-12-22 ENCOUNTER — Ambulatory Visit (INDEPENDENT_AMBULATORY_CARE_PROVIDER_SITE_OTHER): Payer: Medicare Other

## 2018-12-22 VITALS — BP 134/80 | HR 94 | Temp 98.0°F | Ht 63.0 in | Wt 170.4 lb

## 2018-12-22 DIAGNOSIS — J479 Bronchiectasis, uncomplicated: Secondary | ICD-10-CM

## 2018-12-22 DIAGNOSIS — J4540 Moderate persistent asthma, uncomplicated: Secondary | ICD-10-CM | POA: Diagnosis not present

## 2018-12-22 MED ORDER — GUAIFENESIN ER 600 MG PO TB12: 1200.0000 mg | ORAL_TABLET | Freq: Two times a day (BID) | ORAL | Status: AC | PRN

## 2018-12-22 MED ORDER — FLUTICASONE PROPIONATE 50 MCG/ACT NA SUSP: 1.0000 | Freq: Every day | NASAL | 2 refills | Status: AC

## 2018-12-22 NOTE — Patient Instructions (Signed)
Continue saline sinus rinse daily Flonase 1 spray each nostril daily Use an antihistamine pill of your choice nightly for the next week, then as needed Can use mucinex as needed to help loosen phlegm Continue dulera two puffs in the morning and two puffs at night, and rinse mouth after each use Albuterol every 6 hours as needed for cough, wheeze, or chest congestion Continue singulair 10 mg pill nightly Chest xray today Will arrange for pulmonary function test  Follow up in 6 to 8 weeks

## 2018-12-22 NOTE — Progress Notes (Signed)
[name]  Chief Complaint  Patient presents with  . Consult    bronchiectasis and astham    Constitutional:  BP 134/80 (BP Location: Right Arm, Cuff Size: Normal)   Pulse 94   Temp 98 F (36.7 C) (Oral)   Ht 5\' 3"  (1.6 m)   Wt 170 lb 6.4 oz (77.3 kg)   SpO2 98%   BMI 30.19 kg/m   Past Medical History:  Hypothyroidism, PNA, Parkinson disease, HTN, Depression, Anxiety, Low back pain, REM sleep behavior disorder, Insomnia, Constipation, Colon polyps  Brief Summary:  Gail SchneidersRita A Mercado is a 68 y.o. female former smoker with bronchiectasis.  Previously seen by Dr. Sherene SiresWert.  PFT from 2011 showed mild obstruction.  CT chest from 2011 showed upper and mid lung BTX.  She had several episodes of pneumonia when younger.  She has been told she has asthma and allergies.  She has sinus congestion with post nasal drip.  Gets intermittent cough with clear sputum.  Not having fever, wheeze, chest pain, weight loss or sweats.  Not on allergy shots.  Quit smoking years ago.  No skin rash, or leg swelling.  Doesn't feel her breathing is an issue while asleep.  Physical Exam:   Appearance - well kempt   ENMT - clear nasal mucosa, midline nasal  septum, no oral exudates, no LAN, trachea midline  Respiratory - normal chest wall, normal respiratory effort, no accessory muscle use, no wheeze/rales  CV - s1s2 regular rate and rhythm, no murmurs, no peripheral edema, radial pulses symmetric  GI - soft, non tender, no masses  Lymph - no adenopathy noted in neck and axillary areas  MSK - normal gait  Ext - no cyanosis, clubbing, or joint inflammation noted  Skin - no rashes, lesions, or ulcers  Neuro - normal strength, oriented x 3  Psych - normal mood and affect  Discussion:  She reports prior episodes of pneumonia as a child and young adult.  She has long standing history of bronchiectasis.  She also has allergic rhinitis and asthma.  Her current  symptoms seem more related to upper airway with rhinitis.  Assessment/Plan:   Allergic asthma. - continue dulera, singulair, and prn albuterol - will arrange for pulmonary function test  Allergic rhinitis. - nasal irrigation daily - flonase daily - can use OTC antihistamine for one week, then prn - continue singulair  History of bronchiectasis. - continue mucinex, flutter valve prn - chest xray today, and then determine if she needs repeat CT chest imaging - will need to make sure she is up to date on flu and pneumonia vaccinations at next visit  Parkinson disease. - followed by Dr. Arbutus Leasat with neurology   Patient Instructions  Continue saline sinus rinse daily Flonase 1 spray each nostril daily Use an antihistamine pill of your choice nightly for the next week, then as needed Can use mucinex as needed to help loosen phlegm Continue dulera two puffs in the morning and two puffs at night, and rinse mouth after each use Albuterol every 6 hours as needed for cough, wheeze, or chest congestion Continue singulair 10 mg pill nightly Chest xray today Will arrange for pulmonary function test  Follow up in 6 to 8 weeks    Coralyn HellingVineet Jermesha Sottile, MD [facility] Pager: [phone] 12/22/2018, 11:27 AM  Flow Sheet     Pulmonary tests:  PFT 01/09/10 >> FEV1 1.95 (82%), FEV1% 67, TLC 4.90 (96%), DLCO 89%, no BD  Chest  imaging:  CT chest 11/01/09 >> b/l upper/RML/lingluar lobe BTX  Review of Systems:  Reviewed and negative  Medications:   Allergies as of 12/22/2018      Reactions   Ciprofloxacin    REACTION: unknown   Penicillins    REACTION: hives   Wasp Venom Swelling      Medication List       Accurate as of December 22, 2018 11:27 AM. If you have any questions, ask your nurse or doctor.        STOP taking these medications   clonazePAM 0.5 MG tablet Commonly known as: KLONOPIN Stopped by: Chesley Mires, MD   doxycycline 100 MG capsule Commonly known  as: VIBRAMYCIN Stopped by: Chesley Mires, MD     TAKE these medications   albuterol 108 (90 Base) MCG/ACT inhaler Commonly known as: VENTOLIN HFA INHALE 1 PUFF BY MOUTH EVERY 4 TO 6 HOURS AS NEEDED   aspirin-acetaminophen-caffeine 250-250-65 MG tablet Commonly known as: EXCEDRIN MIGRAINE Take 1 tablet by mouth every 6 (six) hours as needed for pain.   CALCIUM + D PO Take 1 tablet by mouth daily.   carbidopa-levodopa 25-100 MG tablet Commonly known as: SINEMET IR Take 1 tablet by mouth 4 (four) times daily. What changed: additional instructions   cholecalciferol 1000 units tablet Commonly known as: VITAMIN D Take 1,000 Units by mouth daily.   Dulera 200-5 MCG/ACT Aero Generic drug: mometasone-formoterol Take 2 puffs by mouth 2 (two) times daily. What changed: Another medication with the same name was removed. Continue taking this medication, and follow the directions you see here. Changed by: Chesley Mires, MD   fluticasone 50 MCG/ACT nasal spray Commonly known as: FLONASE Place 1 spray into both nostrils daily. Started by: Chesley Mires, MD   guaiFENesin 600 MG 12 hr tablet Commonly known as: Mucinex Take 2 tablets (1,200 mg total) by mouth 2 (two) times daily as needed for cough or to loosen phlegm. Started by: Chesley Mires, MD   hydrochlorothiazide 25 MG tablet Commonly known as: HYDRODIURIL Take 25 mg by mouth daily.   levothyroxine 25 MCG tablet Commonly known as: SYNTHROID Take 25 mcg by mouth daily.   montelukast 10 MG tablet Commonly known as: SINGULAIR Take 10 mg by mouth at bedtime.   polyethylene glycol 17 g packet Commonly known as: MIRALAX / GLYCOLAX Take 17 g by mouth daily as needed.   PROBIOTIC-10 PO Take by mouth.   rOPINIRole 8 MG 24 hr tablet Commonly known as: REQUIP XL 1 in the morning, 2 at night   venlafaxine XR 150 MG 24 hr capsule Commonly known as: EFFEXOR-XR Take 1 capsule (150 mg total) by mouth daily with breakfast.   vitamin  B-12 500 MCG tablet Commonly known as: CYANOCOBALAMIN Take 500 mcg by mouth daily.   ZICAM ALLERGY RELIEF NA Place 1 spray into the nose at bedtime. Each nostril PRN       Past Surgical History:  She  has a past surgical history that includes breast biopsy 1976; back surgery in 1996 & 2005; Toe Surgery (Left, 01/09/2015); and Cataract extraction, bilateral (05/31/2015, 06-21-15).  Family History:  Her family history includes Arthritis in her mother; Dementia in her mother; Glaucoma in her brother; Heart disease in her father; Heart failure in her father; High blood pressure in her mother; Osteoarthritis in her mother.  Social History:  She  reports that she quit smoking about 40 years ago. Her smoking use included cigarettes. She started smoking about 46 years ago.  She has a 1.00 pack-year smoking history. She has never used smokeless tobacco. She reports current alcohol use. She reports that she does not use drugs.

## 2018-12-23 ENCOUNTER — Telehealth: Payer: Self-pay | Admitting: Pulmonary Disease

## 2018-12-23 NOTE — Telephone Encounter (Signed)
Call returned to patient, made aware of CXR results per VS. Voiced understanding. Nothing further needed at this time.

## 2018-12-23 NOTE — Telephone Encounter (Signed)
Dg Chest 2 View  Result Date: 12/22/2018 CLINICAL DATA:  Cough with a history of bronchiectasis. EXAM: CHEST - 2 VIEW COMPARISON:  March 17, 2018 FINDINGS: Again identified are scattered bronchitic changes bilaterally, greatest at the lung bases. There is scattered areas of pleuroparenchymal scarring. There is no pneumothorax or significant pleural effusion. There are degenerative changes throughout the thoracic spine. IMPRESSION: No active cardiopulmonary disease. Electronically Signed   By: Constance Holster M.D.   On: 12/22/2018 16:28    Please let her know CXR showed changes from know history of bronchiectasis.  No other worrisome findings.  No change to current treatment plan.

## 2018-12-27 ENCOUNTER — Ambulatory Visit: Payer: Medicare Other | Admitting: Physical Therapy

## 2018-12-29 ENCOUNTER — Ambulatory Visit: Payer: Medicare Other | Admitting: Physical Therapy

## 2019-01-03 ENCOUNTER — Encounter: Payer: Self-pay | Admitting: Physical Therapy

## 2019-01-03 ENCOUNTER — Other Ambulatory Visit: Payer: Self-pay

## 2019-01-03 ENCOUNTER — Ambulatory Visit: Payer: Medicare Other | Admitting: Physical Therapy

## 2019-01-03 DIAGNOSIS — G8929 Other chronic pain: Secondary | ICD-10-CM

## 2019-01-03 DIAGNOSIS — R2689 Other abnormalities of gait and mobility: Secondary | ICD-10-CM

## 2019-01-03 DIAGNOSIS — M545 Low back pain, unspecified: Secondary | ICD-10-CM

## 2019-01-03 DIAGNOSIS — R293 Abnormal posture: Secondary | ICD-10-CM

## 2019-01-03 NOTE — Therapy (Signed)
[facility] [address] [place], [place], [address] Phone: [phone]   Fax:  [phone]  Physical Therapy Treatment  Patient Details  Name: Gail Mercado MRN: [id number] Date of Birth: April 30, 1951 Referring Provider (PT): Tat, Wells Guiles    Encounter Date: 01/03/2019  PT End of Session - 01/03/19 1201    Visit Number  2    Number of Visits  9    Date for PT Re-Evaluation  02/11/19    Authorization Type  [facility] Medicare 10th visit progress noted required    PT Start Time  1108    PT Stop Time  1148    PT Time Calculation (min)  40 min    Activity Tolerance  Patient tolerated treatment well    Behavior During Therapy  [facility] for tasks assessed/performed;Restless   increased truncal and lower extremity dyskinesias noted during seated portion of eval      Past Medical History:  Diagnosis Date  . Anxiety   . Asthma   . Back pain   . Bronchiectasis   . Bronchitis, chronic ([facility])   . COPD (chronic obstructive pulmonary disease) ([facility])   . Depression   . Hypertension   . Hypothyroidism   . Neuromuscular disorder ([facility])    parkinson's disease  . Parkinson disease ([facility])   . Pneumonia   . Pulmonary nodule   . Thyroid disease     Past Surgical History:  Procedure Laterality Date  . back surgery in 1996 & 2005    . breast biopsy 1976    . CATARACT EXTRACTION, BILATERAL  05/31/2015, 06-21-15  . TOE SURGERY Left 01/09/2015    There were no vitals filed for this visit.  Subjective Assessment - 01/03/19 1109    Subjective  Reports no falls. Back pain seems to be the biggest problem because it's hard to go back to sleep.    Pertinent History  asthma, hx of back surgeries    Patient Stated Goals  Pt's goal for therapy is to improve mobility and get rid of pain.    Currently in Pain?  No/denies   had back and lower leg pain this morning.   Pain Onset  More than a month ago         [facility] PT Assessment - 01/03/19 0001       Functional Gait  Assessment   Gait assessed   Yes    Gait Level Surface  Walks 20 ft in less than 5.5 sec, no assistive devices, good speed, no evidence for imbalance, normal gait pattern, deviates no more than 6 in outside of the 12 in walkway width.    Change in Gait Speed  Able to smoothly change walking speed without loss of balance or gait deviation. Deviate no more than 6 in outside of the 12 in walkway width.    Gait with Horizontal Head Turns  Performs head turns smoothly with no change in gait. Deviates no more than 6 in outside 12 in walkway width    Gait with Vertical Head Turns  Performs head turns with no change in gait. Deviates no more than 6 in outside 12 in walkway width.    Gait and Pivot Turn  Pivot turns safely within 3 sec and stops quickly with no loss of balance.    Step Over Obstacle  Is able to step over 2 stacked shoe boxes taped together (9 in total height) without changing gait speed. No evidence of imbalance.    Gait  with Narrow Base of Support  Ambulates 7-9 steps.    Gait with Eyes Closed  Walks 20 ft, no assistive devices, good speed, no evidence of imbalance, normal gait pattern, deviates no more than 6 in outside 12 in walkway width. Ambulates 20 ft in less than 7 sec.    Ambulating Backwards  Walks 20 ft, no assistive devices, good speed, no evidence for imbalance, normal gait    Steps  Two feet to a stair, must use rail.   Due to L LE pain with weightbearing   Total Score  27    FGA comment:  19-24 = medium risk fall                   [facility] Adult PT Treatment/Exercise - 01/03/19 0001      Exercises   Exercises  Knee/Hip      Knee/Hip Exercises: Stretches   Active Hamstring Stretch  3 reps;20 seconds   with ankle pumps; each LE            PT Education - 01/03/19 1158    Education Details  Initiated HEP for walking program and hamstring stretch and the importance of gradually increasing activity for back and LEs to adjust.     Person(s) Educated  Patient    Methods  Explanation;Demonstration;Verbal cues;Handout    Comprehension  Verbalized understanding;Returned demonstration;Need further instruction;Verbal cues required          PT Long Term Goals - 12/13/18 1438      PT LONG TERM GOAL #1   Title  Pt will be independent with HEP for improved flexibility, mobility, decreased pain.  TARGET 01/21/2019 (due to delayed start due to scheduling)    Time  5    Period  Weeks    Status  New    Target Date  01/21/19      PT LONG TERM GOAL #2   Title  Pt will verbalize/demo understanding of walking program for exercise, with no greater than 1-2 point increase in pain with ambulation.    Time  5    Period  Weeks    Status  New    Target Date  01/21/19      PT LONG TERM GOAL #3   Title  Pt will decrease Oswestry Pain score to less than or equal to 25% disability due to back pain, for improved functional mobility.    Time  5    Period  Weeks    Status  New    Target Date  01/21/19      PT LONG TERM GOAL #4   Title  FGA to be assessed, with goal to be written as appropriate, to address balance.    Time  5    Period  Weeks    Status  New    Target Date  01/21/19      PT LONG TERM GOAL #5   Title  Pt will verbalize understanding of local Parkinson's disease resources for exercise and optimal disease management.    Time  5    Period  Weeks    Status  New    Target Date  01/21/19            Plan - 01/03/19 1202    Clinical Impression Statement  Skilled session focused on performing FGA, educating pt on walking program and hamstring stretch pt can perform in bed to address back/hip pain.  Pt scored 27/30 on FGA with slight difficulty with tandem  walk and stair negotiation due to pain in L lower leg with weightbearing.  Discussed gradually increasing time for walking for exercise to address not aggravating LEs; pt verbalized understanding.    Personal Factors and Comorbidities  Comorbidity 3+     Comorbidities  anxiety, depression, hx of 2 back surgeries    Examination-Activity Limitations  Sleep;Locomotion Level    Examination-Participation Restrictions  Other   caring for 1 yo grandchildren   Stability/Clinical Decision Making  Stable/Uncomplicated    Rehab Potential  Good    PT Frequency  2x / week   2x/wk for 4 weeks   PT Duration  Other (comment)   plus eval, total POC = 5 weeks   PT Treatment/Interventions  ADLs/Self Care Home Management;Gait training;Stair training;Functional mobility training;Therapeutic activities;Therapeutic exercise;Balance training;Neuromuscular re-education;Patient/family education;Manual techniques;Moist Heat;Electrical Stimulation    PT Next Visit Plan  Check either FGA add to goals if appropriate; PWR! Moves in sitting, supine, standing; discussed sleeping positioning    PT Home Exercise Plan  [email or web address]    Consulted and Agree with Plan of Care  Patient       Patient will benefit from skilled therapeutic intervention in order to improve the following deficits and impairments:  Abnormal gait, Decreased mobility, Postural dysfunction  Visit Diagnosis: Other abnormalities of gait and mobility  Chronic bilateral low back pain without sciatica  Abnormal posture     Problem List Patient Active Problem List   Diagnosis Date Noted  . Low back pain 09/07/2013  . Right lumbar radiculopathy 09/07/2013  . Hypertension   . Anxiety   . Parkinson disease (H[facility])   . Unspecified essential hypertension 06/30/2012  . Parkinson's disease (tremor, stiffness, slow motion, unstable posture) (H[facility]) 06/30/2012  . PULMONARY NODULE 04/24/2010  . ASTHMA 11/20/2009  . BRONCHIECTASIS W/O ACUTE EXACERBATION 11/20/2009  . DEPRESSION/ANXIETY 11/19/2009    Hortencia ConradiKarissa Chrisy Hillebrand, PTA  01/03/19, 12:10 PM [facility] [address] [place], [place], [address] Phone: [phone]   Fax:  [phone]  Name: Gail SchneidersRita A  Mercado MRN: [id number] Date of Birth: 02-26-1951

## 2019-01-03 NOTE — Patient Instructions (Addendum)
Walking Program:  Begin walking for exercise for 7-10 minutes, 1 times/day, 5 days/week.   Progress your walking program by adding 1-2 minutes to your routine each week, as tolerated. Be sure to wear good walking shoes, walk in a safe environment and only progress to your tolerance.    MAYBE try to find a level walking path.  Access Code: [email or web address]  URL: [email or web address]  Date: 01/03/2019  Prepared by: Bjorn Loser   Exercises Supine Hamstring Stretch with Strap - 3 reps - 2 sets - 1-2x daily - 5x weekly

## 2019-01-05 ENCOUNTER — Other Ambulatory Visit: Payer: Self-pay

## 2019-01-05 ENCOUNTER — Ambulatory Visit: Payer: Medicare Other | Admitting: Physical Therapy

## 2019-01-05 DIAGNOSIS — R2689 Other abnormalities of gait and mobility: Secondary | ICD-10-CM | POA: Diagnosis not present

## 2019-01-05 DIAGNOSIS — R293 Abnormal posture: Secondary | ICD-10-CM

## 2019-01-06 NOTE — Patient Instructions (Signed)
  Work up to 10-20 reps, at least once per day

## 2019-01-06 NOTE — Therapy (Signed)
[facility] [address] [place], [place], [address] Phone: [phone]   Fax:  [phone]  Physical Therapy Treatment  Patient Details  Name: Gail Mercado MRN: [id number] Date of Birth: May 04, 1951 Referring Provider (PT): Tat, Lurena Joinerebecca    Encounter Date: 01/05/2019  CLINIC OPERATION CHANGES: Outpatient Neuro Rehab is open at lower capacity following universal masking, social distancing, and patient screening.  The patient's COVID risk of complications score is 4.   PT End of Session - 01/06/19 1915    Visit Number  3    Number of Visits  9    Date for PT Re-Evaluation  02/11/19    Authorization Type  [facility] visit progress noted required    PT Start Time  1447    PT Stop Time  1526    PT Time Calculation (min)  39 min    Activity Tolerance  Patient tolerated treatment well    Behavior During Therapy  WFL for tasks assessed/performed   increased truncal and lower extremity dyskinesias noted during seated portion of eval      Past Medical History:  Diagnosis Date  . Anxiety   . Asthma   . Back pain   . Bronchiectasis   . Bronchitis, chronic ([facility])   . COPD (chronic obstructive pulmonary disease) ([facility])   . Depression   . Hypertension   . Hypothyroidism   . Neuromuscular disorder ([facility])    parkinson's disease  . Parkinson disease ([facility])   . Pneumonia   . Pulmonary nodule   . Thyroid disease     Past Surgical History:  Procedure Laterality Date  . back surgery in 1996 & 2005    . breast biopsy 1976    . CATARACT EXTRACTION, BILATERAL  05/31/2015, 06-21-15  . TOE SURGERY Left 01/09/2015    There were no vitals filed for this visit.  Subjective Assessment - 01/05/19 1451    Subjective  No changes since last visit.  Really have a hard time getting in and out of the bed.    Pertinent History  asthma, hx of back surgeries    Patient Stated Goals  Pt's goal for therapy is to improve mobility and get  rid of pain.    Pain Score  6     Pain Location  Leg   just below knee   Pain Orientation  Left    Pain Descriptors / Indicators  Aching    Pain Type  Chronic pain    Pain Onset  More than a month ago    Aggravating Factors   worse at night    Pain Relieving Factors  ice, elevating             Therapeutic Activity: Bed mobility:  Pt demonstrates how she would get in and out of bed (sometimes crawls into bed and then rolls into bed; sit<>supine through sidelying with extended time).  In sitting:  Rocking side to side to increase momentum for ease of sit>supine through sidelying, x 4 reps.  Then once sidelying, use of rocking thorugh hips to ease transition to supine.  Practiced supine >R sidelying with use of PWR! Moves (Rock or twist) for increased ease of movement.  Supine>sit through sidelying with use of PWR! Moves for improved ease of movement, x 4 reps.             [facility] - 01/05/19 1454    PWR! exercises  Moves in supine;Moves in Rallsquadraped  Modified quadruped   PWR! Up  x 10 reps    PWR! Rock  x 10 reps (5 reps each side)    PWR! Twist  x 5 reps each side    PWR! Step  x 5 reps each side    Comments  Modified quadruped at chair-for improved posture, weightshifting, trunk flexibility and initiation of stepping    PWR! Up  x 10 reps through shoulders for posture    PWR! Rock  x 5 reps each side for rocking, momentum for ease of bed mobility    PWR! Twist  x 5 reps each side, for improved trunk flexibility    PWR! Step  x 5 reps each side    Comments  PWR! Moves in supine; for improved posture, weigthshifting, trunk flexibility and initiation of stepping; rationale and explanation of each supine PWR! Move to how it would help with bed mobility (particularly rolling and improving momentum for improved supine<>sit)          PT Education - 01/06/19 1914    Education Details  PWR! Moves supine and how they can help with bed mobility    Person(s) Educated   Patient    Methods  Explanation;Demonstration;Verbal cues;Handout    Comprehension  Verbalized understanding;Returned demonstration;Verbal cues required          PT Long Term Goals - 01/06/19 1918      PT LONG TERM GOAL #1   Title  Pt will be independent with HEP for improved flexibility, mobility, decreased pain.  TARGET 01/21/2019 (due to delayed start due to scheduling)    Time  5    Period  Weeks    Status  New      PT LONG TERM GOAL #2   Title  Pt will verbalize/demo understanding of walking program for exercise, with no greater than 1-2 point increase in pain with ambulation.    Time  5    Period  Weeks    Status  New      PT LONG TERM GOAL #3   Title  Pt will decrease Oswestry Pain score to less than or equal to 25% disability due to back pain, for improved functional mobility.    Time  5    Period  Weeks    Status  New      PT LONG TERM GOAL #4   Title  FGA to be assessed, with goal to be written as appropriate, to address balance.    Baseline  Initial FGA score 27/30 (no goal needed)    Time  5    Period  Weeks    Status  Deferred      PT LONG TERM GOAL #5   Title  Pt will verbalize understanding of local Parkinson's disease resources for exercise and optimal disease management.    Time  5    Period  Weeks    Status  New            Plan - 01/06/19 1915    Clinical Impression Statement  Focus of skilled PT session today primarily on introduction of PWR! Moves in supine (to assist with ease of bed mobility) and PWR! Moves in modified quadruped position (to assist with overall flexibility and ease of movement).  Pt performs supine PWR! Moves well, but does need cues for increased intensity of movement to help with rolling and supine<>sit.  Pt responds well to cues and improves performance of bed mobility throughouts session.    Personal  Factors and Comorbidities  Comorbidity 3+    Comorbidities  anxiety, depression, hx of 2 back surgeries     Examination-Activity Limitations  Sleep;Locomotion Level    Examination-Participation Restrictions  Other   caring for 1 yo grandchildren   Stability/Clinical Decision Making  Stable/Uncomplicated    Rehab Potential  Good    PT Frequency  2x / week   2x/wk for 4 weeks   PT Duration  Other (comment)   plus eval, total POC = 5 weeks   PT Treatment/Interventions  ADLs/Self Care Home Management;Gait training;Stair training;Functional mobility training;Therapeutic activities;Therapeutic exercise;Balance training;Neuromuscular re-education;Patient/family education;Manual techniques;Moist Heat;Electrical Stimulation    PT Next Visit Plan  Review PWR! Moves in supine and ask about bed mobility; perform modified quadruped PWR! Moves again (and possibly give as HEP); standing PWR! Moves if appropriate, review/discuss progresion of walking program (?walking poles)    PT Home Exercise Plan  [email or web address]    Consulted and Agree with Plan of Care  Patient       Patient will benefit from skilled therapeutic intervention in order to improve the following deficits and impairments:  Abnormal gait, Decreased mobility, Postural dysfunction  Visit Diagnosis: Abnormal posture     Problem List Patient Active Problem List   Diagnosis Date Noted  . Low back pain 09/07/2013  . Right lumbar radiculopathy 09/07/2013  . Hypertension   . Anxiety   . Parkinson disease (HCC)   . Unspecified essential hypertension 06/30/2012  . Parkinson's disease (tremor, stiffness, slow motion, unstable posture) ([facility]) 06/30/2012  . PULMONARY NODULE 04/24/2010  . ASTHMA 11/20/2009  . BRONCHIECTASIS W/O ACUTE EXACERBATION 11/20/2009  . DEPRESSION/ANXIETY 11/19/2009    Collyn Selk W. 01/06/2019, 7:20 PM  Gean Maidens., PT   [facility] [address] [place], [place], [address] Phone: [phone]   Fax:  [phone]  Name: Gail Mercado MRN: [id number] Date of  Birth: [birth year]

## 2019-01-10 ENCOUNTER — Other Ambulatory Visit: Payer: Self-pay

## 2019-01-10 ENCOUNTER — Encounter: Payer: Self-pay | Admitting: Physical Therapy

## 2019-01-10 ENCOUNTER — Ambulatory Visit: Payer: Medicare Other | Admitting: Physical Therapy

## 2019-01-10 DIAGNOSIS — R293 Abnormal posture: Secondary | ICD-10-CM

## 2019-01-10 DIAGNOSIS — G8929 Other chronic pain: Secondary | ICD-10-CM

## 2019-01-10 DIAGNOSIS — R2689 Other abnormalities of gait and mobility: Secondary | ICD-10-CM | POA: Diagnosis not present

## 2019-01-10 NOTE — Patient Instructions (Signed)
Provided handouts from [facility] [employer] [facility]:  1-Supine pelvic tilts, 5-10 reps, 3 sec hold  2-Single knee to chest, 3 reps each side, 10-15 sec hold  3-Trunk rotation in supine, rocking, then to each side 3 reps 10-15 sec hold  Instructed pt to perform in the night, when in bed, to try to less nighttime back pain.

## 2019-01-10 NOTE — Therapy (Signed)
[facility] [address] [place], [place], [address] Phone: [phone]   Fax:  [phone]  Physical Therapy Treatment  Patient Details  Name: Gail Mercado MRN: [id number] Date of Birth: 09/07/1950 Referring Provider (PT): Tat, Lurena Joiner    Encounter Date: 01/10/2019 CLINIC OPERATION CHANGES: Outpatient Neuro Rehab is open at lower capacity following universal masking, social distancing, and patient screening.  The patient's COVID risk of complications score is 4.  PT End of Session - 01/10/19 1301    Visit Number  4    Number of Visits  9    Date for PT Re-Evaluation  02/11/19    Authorization Type  [facility] Medicare 10th visit progress noted required    PT Start Time  1022    PT Stop Time  1103    PT Time Calculation (min)  41 min    Activity Tolerance  Patient tolerated treatment well    Behavior During Therapy  WFL for tasks assessed/performed   increased truncal and lower extremity dyskinesias noted during seated portion of eval      Past Medical History:  Diagnosis Date  . Anxiety   . Asthma   . Back pain   . Bronchiectasis   . Bronchitis, chronic (HCC)   . COPD (chronic obstructive pulmonary disease) (HCC)   . Depression   . Hypertension   . Hypothyroidism   . Neuromuscular disorder (HCC)    parkinson's disease  . Parkinson disease (HCC)   . Pneumonia   . Pulmonary nodule   . Thyroid disease     Past Surgical History:  Procedure Laterality Date  . back surgery in 1996 & 2005    . breast biopsy 1976    . CATARACT EXTRACTION, BILATERAL  05/31/2015, 06-21-15  . TOE SURGERY Left 01/09/2015    There were no vitals filed for this visit.  Subjective Assessment - 01/10/19 1025    Subjective  Went to the walk-in clinic because my knee was bothering me so much.  Doctor thinks it might be a torn ligament, due to the length of time.  Added anti-inflammatory medication (pt unsure of name) and it has helped. (PT  asked about any restrictions/precautions; pt states none.  Later in PT session, she reports she was given knee brace for L knee, but is not wearing.)    Pertinent History  asthma, hx of back surgeries    Patient Stated Goals  Pt's goal for therapy is to improve mobility and get rid of pain.    Currently in Pain?  Yes    Pain Score  1     Pain Location  Leg   just below knee   Pain Orientation  Left    Pain Descriptors / Indicators  Aching    Pain Type  Chronic pain    Pain Onset  More than a month ago    Pain Frequency  Intermittent    Aggravating Factors   worse at night    Pain Relieving Factors  ice elevating    Multiple Pain Sites  Yes    Pain Score  1    Pain Location  Back    Pain Orientation  Right;Left;Lower    Pain Descriptors / Indicators  Aching    Pain Type  Chronic pain    Pain Frequency  Intermittent    Aggravating Factors   worse at night    Pain Relieving Factors  carbidopa, Levodopa    Pain Score  4  Pain Location  Shoulder    Pain Orientation  Left    Pain Descriptors / Indicators  Aching    Pain Type  Acute pain    Pain Onset  In the past 7 days    Pain Frequency  Intermittent    Aggravating Factors   raising left arm    Pain Relieving Factors  rest, ice                       [facility] Adult PT Treatment/Exercise - 01/10/19 0001      Exercises   Exercises  Lumbar      Lumbar Exercises: Stretches   Active Hamstring Stretch  Right;Left;2 reps;20 seconds   seated at edge of mat   Single Knee to Chest Stretch  Right;Left;3 reps;10 seconds    Lower Trunk Rotation  3 reps;10 seconds    Lower Trunk Rotation Limitations  Initiated with rocking side to side, 5 reps    Pelvic Tilt  10 reps   each, in supine and sitting     Lumbar Exercises: Standing   Other Standing Lumbar Exercises  Posture exercises in standing at doorframe:  standing with best upright posture x 3 minutes.  Scapular retraction with open/straight arms x 10 reps (cues for  shoulder positioning); scapular squeezes with flexed elbows x 10 reps      Lumbar Exercises: Seated   Other Seated Lumbar Exercises  Lumbar stabilization exercises:  Abdominal activation with UE lifts x 10 reps, then marching in place x 2 sets of 5 reps.  Then LAQ x 5 reps       Neuro Re-education   [facility]) - 01/10/19 1035    PWR! Up  x 10     PWR! Rock  x 10     PWR! Twist  x 10    PWR! Step  x 10 reps    Comments  Performed modified quadruped PWR! Moves standing at chair for support.  Pt limited today in fully performing due to L shoulder pain (worse with L UE lifting )       Verbal review of PWR! Moves in supine, with pt verbalizing understanding.  Pt demonstrates supine>sit through sidelying with increased time and use of LUE with supine>sit through sidelying.  Practiced additional rep with cues for using trunk rotation for improved momentum, ease of movement supine>sit.    PT Education - 01/10/19 1300    Education Details  Lumbar stretches and flexibility (to be performed at night to help ease back pain, improve sleeping)    Person(s) Educated  Patient    Methods  Explanation;Demonstration;Handout    Comprehension  Verbalized understanding;Returned demonstration          PT Long Term Goals - 01/06/19 1918      PT LONG TERM GOAL #1   Title  Pt will be independent with HEP for improved flexibility, mobility, decreased pain.  TARGET 01/21/2019 (due to delayed start due to scheduling)    Time  5    Period  Weeks    Status  New      PT LONG TERM GOAL #2   Title  Pt will verbalize/demo understanding of walking program for exercise, with no greater than 1-2 point increase in pain with ambulation.    Time  5    Period  Weeks    Status  New      PT LONG TERM GOAL #3   Title  Pt will decrease Oswestry  Pain score to less than or equal to 25% disability due to back pain, for improved functional mobility.    Time  5    Period  Weeks    Status  New      PT LONG TERM GOAL  #4   Title  FGA to be assessed, with goal to be written as appropriate, to address balance.    Baseline  Initial FGA score 27/30 (no goal needed)    Time  5    Period  Weeks    Status  Deferred      PT LONG TERM GOAL #5   Title  Pt will verbalize understanding of local Parkinson's disease resources for exercise and optimal disease management.    Time  5    Period  Weeks    Status  New            Plan - 01/10/19 1302    Clinical Impression Statement  Pt with new onset L shoulder pain today, limiting UE movements of PWR! Moves due to painful reaching/lifting/overhead movements.  Explained importance of positioning and posture in proper body mechanics with UE lifting (pt continues to have shoulder pain limiting shoulder movement >90 degres).  Pt reports improvement in bed mobility, but back pain is limiting sleep.  Remainder of session focused on stretches spefically to address low back pain.  Pt will continue to benefit from further skilled PT to improve pain, functional mobility.    Personal Factors and Comorbidities  Comorbidity 3+    Comorbidities  anxiety, depression, hx of 2 back surgeries    Examination-Activity Limitations  Sleep;Locomotion Level    Examination-Participation Restrictions  Other   caring for 1 yo grandchildren   Stability/Clinical Decision Making  Stable/Uncomplicated    Rehab Potential  Good    PT Frequency  2x / week   2x/wk for 4 weeks   PT Duration  Other (comment)   plus eval, total POC = 5 weeks   PT Treatment/Interventions  ADLs/Self Care Home Management;Gait training;Stair training;Functional mobility training;Therapeutic activities;Therapeutic exercise;Balance training;Neuromuscular re-education;Patient/family education;Manual techniques;Moist Heat;Electrical Stimulation    PT Next Visit Plan  If pt's shoulder pain has improved and allows, fully review PWR! Moves in supine and ask about bed mobility; perform modified quadruped PWR! Moves again (and  possibly give as HEP); standing PWR! Moves if appropriate, review/discuss progresion of walking program (?walking poles)   Make sure pt has brought her L knee brace, as MD recommended   PT Home Exercise Plan  [id number]    Consulted and Agree with Plan of Care  Patient       Patient will benefit from skilled therapeutic intervention in order to improve the following deficits and impairments:  Abnormal gait, Decreased mobility, Postural dysfunction  Visit Diagnosis: Abnormal posture  Chronic bilateral low back pain without sciatica     Problem List Patient Active Problem List   Diagnosis Date Noted  . Low back pain 09/07/2013  . Right lumbar radiculopathy 09/07/2013  . Hypertension   . Anxiety   . Parkinson disease (HCC)   . Unspecified essential hypertension 06/30/2012  . Parkinson's disease (tremor, stiffness, slow motion, unstable posture) (HCC) 06/30/2012  . PULMONARY NODULE 04/24/2010  . ASTHMA 11/20/2009  . BRONCHIECTASIS W/O ACUTE EXACERBATION 11/20/2009  . DEPRESSION/ANXIETY 11/19/2009    Aven Christen W. 01/10/2019, 1:07 PM Gean MaidensMARRIOTT,Jermy Couper W., PT [facility] [address] [place], [place], [address] Phone: [phone]   Fax:  [phone]  Name: Carter SinkRita  Anselm Pancoast Mercado MRN: [id number] Date of Birth: 1950-08-17

## 2019-01-12 ENCOUNTER — Ambulatory Visit: Payer: Medicare Other | Attending: Neurology | Admitting: Physical Therapy

## 2019-01-12 ENCOUNTER — Encounter: Payer: Self-pay | Admitting: Physical Therapy

## 2019-01-12 ENCOUNTER — Other Ambulatory Visit: Payer: Self-pay

## 2019-01-12 DIAGNOSIS — G8929 Other chronic pain: Secondary | ICD-10-CM | POA: Diagnosis present

## 2019-01-12 DIAGNOSIS — R293 Abnormal posture: Secondary | ICD-10-CM | POA: Diagnosis present

## 2019-01-12 DIAGNOSIS — M545 Low back pain: Secondary | ICD-10-CM | POA: Insufficient documentation

## 2019-01-12 DIAGNOSIS — R2689 Other abnormalities of gait and mobility: Secondary | ICD-10-CM | POA: Diagnosis present

## 2019-01-12 NOTE — Therapy (Signed)
[facility] [address] [place], [place], [address] Phone: [phone]   Fax:  [phone]  Physical Therapy Treatment  Patient Details  Name: Gail Mercado MRN: [id number] Date of Birth: [birth year] Referring Provider (PT): Tat, Wells Guiles    Encounter Date: 01/12/2019  CLINIC OPERATION CHANGES: Outpatient Neuro Rehab is open at lower capacity following universal masking, social distancing, and patient screening.  The patient's COVID risk of complications score is 4.   PT End of Session - 01/12/19 1114    Visit Number  5    Number of Visits  9    Date for PT Re-Evaluation  02/11/19    Authorization Type  [facility] Medicare 10th visit progress noted required    PT Start Time  1023    PT Stop Time  1101    PT Time Calculation (min)  38 min    Activity Tolerance  Patient tolerated treatment well    Behavior During Therapy  WFL for tasks assessed/performed   increased truncal and lower extremity dyskinesias noted during seated portion of eval      Past Medical History:  Diagnosis Date  . Anxiety   . Asthma   . Back pain   . Bronchiectasis   . Bronchitis, chronic ([place])   . COPD (chronic obstructive pulmonary disease) ([place])   . Depression   . Hypertension   . Hypothyroidism   . Neuromuscular disorder ([place])    parkinson's disease  . Parkinson disease ([place])   . Pneumonia   . Pulmonary nodule   . Thyroid disease     Past Surgical History:  Procedure Laterality Date  . back surgery in 1996 & 2005    . breast biopsy 1976    . CATARACT EXTRACTION, BILATERAL  05/31/2015, 06-21-15  . TOE SURGERY Left 01/09/2015    There were no vitals filed for this visit.  Subjective Assessment - 01/12/19 1027    Subjective  Pain comes mostly at night; 0/10 pain right now. Knee is goind good; I wore the brace today.    Pertinent History  asthma, hx of back surgeries    Patient Stated Goals  Pt's goal for therapy is to improve mobility  and get rid of pain.    Currently in Pain?  No/denies   Rates back pain at night as 8/10   Pain Onset  More than a month ago    Pain Onset  In the past 7 days                       [facility] Adult PT Treatment/Exercise - 01/12/19 0001      Lumbar Exercises: Stretches   Single Knee to Chest Stretch  Right;Left;3 reps;10 seconds    Lower Trunk Rotation  3 reps;10 seconds    Lower Trunk Rotation Limitations  Initiated with rocking side to side, 5 reps    Pelvic Tilt  10 reps      Lumbar Exercises: Supine   Bridge  10 reps    Other Supine Lumbar Exercises  Hooklying Marching 10 reps        [facility]) - 01/12/19 1037    PWR! exercises  Moves in sitting;Moves in supine;Moves in quadraped   Modified quadruped   PWR! Up  x 10 reps    PWR! Rock  x 10 reps    PWR! Twist  did not perform (pt hesitant due to shoulder pain last visit)    PWR! Step  x 10 reps    Comments  Performed modified quadruped at chair    PWR! Up  x 10 reps through shoulders    PWR! Rock  x 10 reps each side    PWR! Twist  x 10 reps each side    PWR! Step  x 10 reps each side, single step out and in    Comments  PWR! Moves in supine, review of HEP with pt return demo understanding    PWR! Sit to Stand  2 sets x 5 reps    PWR! Up  x 10, with additional initial 3 reps performed slowly for trunk stretching    PWR! Rock  x 10, through hips only    PWR! Twist  x 5 reps each side (modified axial mobility, trunk rotation)    PWR! Step  x 10 reps each side, single step out and in    Comments  PWR! Moves in sitting       Ended session with 220 ft of gait, with cues for upright posture, relaxed reciprocal arm swing   PT Education - 01/12/19 1114    Education Details  PWR! Moves in modified quadruped, PWR! Up in sitting    Person(s) Educated  Patient    Methods  Explanation;Demonstration;Handout    Comprehension  Verbalized understanding;Returned demonstration          PT Long Term Goals - 01/06/19  1918      PT LONG TERM GOAL #1   Title  Pt will be independent with HEP for improved flexibility, mobility, decreased pain.  TARGET 01/21/2019 (due to delayed start due to scheduling)    Time  5    Period  Weeks    Status  New      PT LONG TERM GOAL #2   Title  Pt will verbalize/demo understanding of walking program for exercise, with no greater than 1-2 point increase in pain with ambulation.    Time  5    Period  Weeks    Status  New      PT LONG TERM GOAL #3   Title  Pt will decrease Oswestry Pain score to less than or equal to 25% disability due to back pain, for improved functional mobility.    Time  5    Period  Weeks    Status  New      PT LONG TERM GOAL #4   Title  FGA to be assessed, with goal to be written as appropriate, to address balance.    Baseline  Initial FGA score 27/30 (no goal needed)    Time  5    Period  Weeks    Status  Deferred      PT LONG TERM GOAL #5   Title  Pt will verbalize understanding of local Parkinson's disease resources for exercise and optimal disease management.    Time  5    Period  Weeks    Status  New            Plan - 01/12/19 1115    Clinical Impression Statement  Pt with no c/o shoulder pain today, as it is better than last visit (she feels she slept on it wrong); however, she avoids some UE movement patterns due to fear of return of pain today.  REviewed supine PWR! Moves and performed additional low back stretches and seated, modified quadruped positions of PWR! Moves, to address overall posture, weigthshifting, trunk flexibility, and initiation of stepping.  Pt tolerates  exercises well and will continue to benefit from skilled PT to promote flexibility, posture, overall improved mobility.    Personal Factors and Comorbidities  Comorbidity 3+    Comorbidities  anxiety, depression, hx of 2 back surgeries    Examination-Activity Limitations  Sleep;Locomotion Level    Examination-Participation Restrictions  Other   caring for 1  yo grandchildren   Stability/Clinical Decision Making  Stable/Uncomplicated    Rehab Potential  Good    PT Frequency  2x / week   2x/wk for 4 weeks   PT Duration  Other (comment)   plus eval, total POC = 5 weeks   PT Treatment/Interventions  ADLs/Self Care Home Management;Gait training;Stair training;Functional mobility training;Therapeutic activities;Therapeutic exercise;Balance training;Neuromuscular re-education;Patient/family education;Manual techniques;Moist Heat;Electrical Stimulation    PT Next Visit Plan  Review Modified quadruped PWR! Moves and seated PWR! Up (add remaining seated PWR! Moves as able); discuss progression of walking program/?walking poles.  Begin checking LTGs and discuss d/c.    PT Home Exercise Plan  [id number]    Consulted and Agree with Plan of Care  Patient       Patient will benefit from skilled therapeutic intervention in order to improve the following deficits and impairments:  Abnormal gait, Decreased mobility, Postural dysfunction  Visit Diagnosis: Chronic bilateral low back pain without sciatica  Abnormal posture     Problem List Patient Active Problem List   Diagnosis Date Noted  . Low back pain 09/07/2013  . Right lumbar radiculopathy 09/07/2013  . Hypertension   . Anxiety   . Parkinson disease (HCC)   . Unspecified essential hypertension 06/30/2012  . Parkinson's disease (tremor, stiffness, slow motion, unstable posture) (HCC) 06/30/2012  . PULMONARY NODULE 04/24/2010  . ASTHMA 11/20/2009  . BRONCHIECTASIS W/O ACUTE EXACERBATION 11/20/2009  . DEPRESSION/ANXIETY 11/19/2009    Brandolyn Shortridge W. 01/12/2019, 11:21 AM  Gean Maidens., PT   [facility] [address] [place], [place], [address] Phone: [phone]   Fax:  [phone]  Name: LARAY RUELL MRN: [id number] Date of Birth: [birth year]

## 2019-01-12 NOTE — Patient Instructions (Signed)
Provided handouts for PWR! Moves -Modified quadruped position, 10-20 reps, 1-2 times per day  Provided handout for PWR! Up in sitting, 10-20 reps, 1-2 times per day  Discussed how to use the above PWR! Moves as stretching/warm up and flexibility exercises and/or for increased power and strength.  Explained how performing these combinations of exercises with her supine PWR! Moves consistently may improve overall flexibility and back pain.

## 2019-01-18 ENCOUNTER — Ambulatory Visit: Payer: Medicare Other | Admitting: Neurology

## 2019-01-19 ENCOUNTER — Ambulatory Visit: Payer: Medicare Other | Admitting: Physical Therapy

## 2019-01-20 ENCOUNTER — Ambulatory Visit: Payer: Medicare Other | Admitting: Physical Therapy

## 2019-01-24 ENCOUNTER — Other Ambulatory Visit: Payer: Self-pay

## 2019-01-24 ENCOUNTER — Ambulatory Visit: Payer: Medicare Other | Admitting: Physical Therapy

## 2019-01-24 ENCOUNTER — Encounter: Payer: Self-pay | Admitting: Physical Therapy

## 2019-01-24 DIAGNOSIS — R2689 Other abnormalities of gait and mobility: Secondary | ICD-10-CM

## 2019-01-24 DIAGNOSIS — M545 Low back pain: Secondary | ICD-10-CM | POA: Diagnosis not present

## 2019-01-24 DIAGNOSIS — R293 Abnormal posture: Secondary | ICD-10-CM

## 2019-01-24 NOTE — Therapy (Signed)
[facility] [address] [place], [place], [address] Phone: [phone]   Fax:  [phone]  Physical Therapy Treatment  Patient Details  Name: Gail SchneidersRita A Mercado MRN: [id number] Date of Birth: 25-Mar-1951 Referring Provider (PT): Tat, Lurena Joinerebecca    Encounter Date: 01/24/2019  CLINIC OPERATION CHANGES: Outpatient Neuro Rehab is open at lower capacity following universal masking, social distancing, and patient screening.  The patient's COVID risk of complications score is 4.   PT End of Session - 01/24/19 1953    Visit Number  6    Number of Visits  9    Date for PT Re-Evaluation  02/11/19    [facility]ization Type  [facility] visit progress noted required    PT Start Time  1022    PT Stop Time  1100    PT Time Calculation (min)  38 min    Activity Tolerance  Patient tolerated treatment well    Behavior During Therapy  WFL for tasks assessed/performed   increased truncal and lower extremity dyskinesias noted during seated portion of eval      Past Medical History:  Diagnosis Date  . Anxiety   . Asthma   . Back pain   . Bronchiectasis   . Bronchitis, chronic (HCC)   . COPD (chronic obstructive pulmonary disease) (HCC)   . Depression   . Hypertension   . Hypothyroidism   . Neuromuscular disorder (HCC)    parkinson's disease  . Parkinson disease (HCC)   . Pneumonia   . Pulmonary nodule   . Thyroid disease     Past Surgical History:  Procedure Laterality Date  . back surgery in 1996 & 2005    . breast biopsy 1976    . CATARACT EXTRACTION, BILATERAL  05/31/2015, 06-21-15  . TOE SURGERY Left 01/09/2015    There were no vitals filed for this visit.  Subjective Assessment - 01/24/19 1024    Subjective  Went to see Dr. Nolon NationsStern-he said that I'm having a lot of arthritis in my back.  Pushing up with my left arm from the bed tweaked my shoulder.  Had an x-ray for my back (haven't gotten results), to have MRI.    Pertinent History  asthma, hx of back surgeries    Patient Stated Goals  Pt's goal for therapy is to improve mobility and get rid of pain.    Currently in Pain?  No/denies   No pain rated during PT session; pt reports worse pain at night   Pain Onset  More than a month ago    Pain Onset  In the past 7 days                       [facility] - 01/24/19 0001      Ambulation/Gait   Ambulation/Gait  Yes    Ambulation/Gait Assistance  6: Modified independent (Device/Increase time)    Ambulation Distance (Feet)  430 Feet   in 2:30; requests to stop due to high tightness   Assistive device  None    Gait Pattern  Step-through pattern;Narrow base of support    Ambulation Surface  Level;Indoor    Gait Comments  After short walk (2:30 seconds) limited by hip tightness, PT demonstrates stretches in sitting and standing that pt can try to do as ways to manage pain and improve ability for longer distance ambulation:  seated hip external rotation stretch, 3 reps x 30 seconds.  Standing anterior/posterior  weigthshifting (for hamstrings/low back stretch) with UE supported at chair, x 5 reps      Posture/Postural Control   Posture/Postural Control  Postural limitations    Postural Limitations  Rounded Shoulders    Posture Comments  Standing against doorframe:  scapular squeezes x 5 reps, neck retraction x 5 reps      Self-Care   Self-Care  Other Self-Care Comments    Other Self-Care Comments   Discussed pt's pain (back pain at night-have been unable to reproduce in PT sessions), L shoulder pain (new in past several weeks, unsure if it may have to due to rounded shoulder posture with reaching activities-advised pt to speak with MD if pain is ongoing); hip pain with gait, reported foot pain with gait during PT session today.  Discussed exercises that pt has been given and strategies for overall improved flexibility, low back stretches, ease of movement; discussed using  these exercises as strategies to help with stiffness and pain throughout the day.  Discussed plans for possible d/c from PT next visit.      Lumbar Exercises: Seated   Other Seated Lumbar Exercises  Seated anterior/posterior pelvic tilts x 5 reps, then seated trunk rotation x 5 reps each side (as means to perform seated low back flexibility exercises throughout the day, as possible alternatives to the supine exercises she has).          [facility]) - 01/24/19 1948    PWR! exercises  Moves in sitting;Moves in quadraped   Modified quadruped, at chair   PWR! Up  x 10 reps    PWR! Rock  x 10 reps    PWR! Twist  did not perform due to c/o L shoulder pain    PWR! Step  x 10 reps    Comments  Review of HEP from last visit-pt return demo with initial cues, does not report performing consistently at home    PWR! Up  x 10 reps, push up tall to sit (UEs resting on knees), for posture stretch          PT Education - 01/24/19 1952    Education Details  Seated hip external rotation stretch; to use as a means to manage pain with gait    Person(s) Educated  Patient    Methods  Explanation;Demonstration;Handout    Comprehension  Verbalized understanding;Returned demonstration          PT Long Term Goals - 01/06/19 1918      PT LONG TERM GOAL #1   Title  Pt will be independent with HEP for improved flexibility, mobility, decreased pain.  TARGET 01/21/2019 (due to delayed start due to scheduling)    Time  5    Period  Weeks    Status  New      PT LONG TERM GOAL #2   Title  Pt will verbalize/demo understanding of walking program for exercise, with no greater than 1-2 point increase in pain with ambulation.    Time  5    Period  Weeks    Status  New      PT LONG TERM GOAL #3   Title  Pt will decrease Oswestry Pain score to less than or equal to 25% disability due to back pain, for improved functional mobility.    Time  5    Period  Weeks    Status  New      PT LONG TERM GOAL #4    Title  FGA to be assessed, with  goal to be written as appropriate, to address balance.    Baseline  Initial FGA score 27/30 (no goal needed)    Time  5    Period  Weeks    Status  Deferred      PT LONG TERM GOAL #5   Title  Pt will verbalize understanding of local Parkinson's disease resources for exercise and optimal disease management.    Time  5    Period  Weeks    Status  New            Plan - 01/24/19 1954    Clinical Impression Statement  Pt continues to report back pain is worse at night; no back pain during PT session.  She does continue to report L shoulder pain, which seems to be worse with rounded shoulders posture and reaching activiteis.  Educated pt in posture/positioning, use of exercises throughout her day to manage pain that is brought on by varied activity.  Pt has been given several positions of PWR! Moves exercises for general flexibility, posture, weigthshfiting as well as supine/seated exercises/stretches for back and hip pain.  Pt has been back to see Dr. Vertell Limber regarding ongoing back pain; PT will check LTGs and many plan f or d/c next visit.    Personal Factors and Comorbidities  Comorbidity 3+    Comorbidities  anxiety, depression, hx of 2 back surgeries    Examination-Activity Limitations  Sleep;Locomotion Level    Examination-Participation Restrictions  Other   caring for 43 yo grandchildren   Stability/Clinical Decision Making  Stable/Uncomplicated    Rehab Potential  Good    PT Frequency  2x / week   2x/wk for 4 weeks   PT Duration  Other (comment)   plus eval, total POC = 5 weeks   PT Treatment/Interventions  ADLs/Self Care Home Management;Gait training;Stair training;Functional mobility training;Therapeutic activities;Therapeutic exercise;Balance training;Neuromuscular re-education;Patient/family education;Manual techniques;Moist Heat;Electrical Stimulation    PT Next Visit Plan  Check LTGs, review stretch added 01/24/2019 visit; walking program (using  stretches at intervals to reduce pain/increase walking time)    PT Home Exercise Plan  [id number]    Consulted and Agree with Plan of Care  Patient       Patient will benefit from skilled therapeutic intervention in order to improve the following deficits and impairments:  Abnormal gait, Decreased mobility, Postural dysfunction  Visit Diagnosis: Abnormal posture  Other abnormalities of gait and mobility     Problem List Patient Active Problem List   Diagnosis Date Noted  . Low back pain 09/07/2013  . Right lumbar radiculopathy 09/07/2013  . Hypertension   . Anxiety   . Parkinson disease ([place])   . Unspecified essential hypertension 06/30/2012  . Parkinson's disease (tremor, stiffness, slow motion, unstable posture) ([place]) 06/30/2012  . PULMONARY NODULE 04/24/2010  . ASTHMA 11/20/2009  . BRONCHIECTASIS W/O ACUTE EXACERBATION 11/20/2009  . DEPRESSION/ANXIETY 11/19/2009    Diora Bellizzi W. 01/24/2019, 7:59 PM  Frazier Butt., PT   [facility] [address] [place], [place], [address] Phone: [phone]   Fax:  [phone]  Name: GAYLIN OSORIA MRN: [id number] Date of Birth: 1951-02-09

## 2019-01-24 NOTE — Progress Notes (Signed)
Virtual Visit via Video Note The purpose of this virtual visit is to provide medical care while limiting exposure to the novel coronavirus.    Consent was obtained for video visit:  Yes.   Answered questions that patient had about telehealth interaction:  Yes.   I discussed the limitations, risks, security and privacy concerns of performing an evaluation and management service by telemedicine. I also discussed with the patient that there may be a patient responsible charge related to this service. The patient expressed understanding and agreed to proceed.  Pt location: Home Physician Location: office Name of referring provider:  Juluis RainierBarnes, Elizabeth, MD I connected with Gail Mercado at patients initiation/request on 01/28/2019 at 10:45 AM EDT by video enabled telemedicine application and verified that I am speaking with the correct person using two identifiers. Pt MRN:  [id number] Pt DOB:  [birth year] Video Participants:  Gail Mercado;     History of Present Illness:  Patient seen today in follow-up for Parkinson's disease.  Patient is on carbidopa/levodopa 25/100, 1 tablet 5 times per day.  She is also on Requip XL, 8 mg, 1 tablet in the morning and 2 tablets at night.  She has had no falls since last visit.  No lightheadedness or near syncope.  No hallucinations.  Mood has been good on Effexor XR, 150 mg daily.  Last visit, she was complaining about more low back pain.  I did refer her to Dr. Venetia MaxonStern, whom she has seen before.  She saw him on September 9.  I did review those records.  He recommended repeat imaging of the lumbar spine and found that they can likely do injections.  He did not feel that she likely needed surgery.  She is scheduled for MRI next Wednesday.  She finished PT last Wednesday. She is having trouble sleeping but it is because of "tightening" in her back.  She isn't sure if this is back pain or from her PD.    PREVIOUS MEDICATIONS: Requip and carbidopa/levodopa; entacapone  (felt made her worse and d/c after few days)  Observations/Objective:   Vitals:   01/28/19 0823  Weight: 170 lb (77.1 kg)  Height: 5\' 3"  (1.6 m)   GEN:  The patient appears stated age and is in NAD.  Neurological examination:  Orientation: The patient is alert and oriented x3. Cranial nerves: There is good facial symmetry. There is no facial hypomimia.  The speech is fluent and clear. Soft palate rises symmetrically and there is no tongue deviation. Hearing is intact to conversational tone. Motor: Strength is at least antigravity x 4.   Shoulder shrug is equal and symmetric.  There is no pronator drift.  Movement examination: Tone: unable Abnormal movements: there is mild to mod axial dyskinesia Coordination:  There is no decremation with RAM's, with hand opening and closing, finger taps or alternation of supination/pronation of the forearm bilaterally.  Unable to see feet to assess them Gait and Station: The patient has no difficulty arising out of a deep-seated chair without the use of the hands. The patient's stride length is good with good arm swing    Assessment and Plan:   1.  Idiopathic Parkinson's disease.  The patient has tremor, bradykinesia, rigidity and mild postural instability.  Dx in 2012 per pt.             -We discussed the diagnosis as well as pathophysiology of the disease.  We discussed treatment options as well as prognostic indicators.  Patient education  was provided.             -she will continue the carbidopa/levodopa 25/100, 1 tablet 5 times per day.             -continue requip xl, , 8 mg, 1 in the AM, 2 at night.  -Start carbidopa/levodopa 50/200 at bedtime.  We will see if this helps the stiffness that she is feeling at night, or if this is associated with her back pain more than the Parkinson's disease.  -she had mod dyskinesia but didn't notice much of it and isn't bothersome.  Can consider amantadine in the future  -having cramping in hands and feet.   She is to take note the timing of this so I can see if this is a wearing off phenomenon.  She has already failed comtan  -discussed DBS in detail with the patient.  Discussed window of opportunity with this.  Discussed that she is in this window.  She has several questions and I answered those to the best of my ability.  She is going to think about this.              2.  Parkinsons dyskinesia             -as above.  May consider amantadine, but overall the dyskinesia is not that bothersome for her  3.  Constipation             -Has seen Dr. Fuller Plan.  Doing better with daily MiraLAX.  4.  Insomnia with occasional RBD             -melatonin not helping             -on Clonazepam but only prn.    Takes it about 50% of the nights.  5.  depression             -doing better with effexor XR 150 mg daily.  Risks, benefits, side effects and alternative therapies were discussed.  The opportunity to ask questions was given and they were answered to the best of my ability.  The patient expressed understanding and willingness to follow the outlined treatment protocols.  6.  Low back pain  -likely due to chronic issues.  Pt has a history of chronic low back pain and saw Dr. Vertell Limber again on January 19, 2019.  He is going to repeat imaging of the back and is supposed to have her MRI next week.  He does not think she likely needs surgery, and feels that pain management is the way to go.  She has seen Dr. Maryjean Ka in the past.  Follow Up Instructions:  5 months  -I discussed the assessment and treatment plan with the patient. The patient was provided an opportunity to ask questions and all were answered. The patient agreed with the plan and demonstrated an understanding of the instructions.   The patient was advised to call back or seek an in-person evaluation if the symptoms worsen or if the condition fails to improve as anticipated.    Total Time spent in visit with the patient was:  25 min, of which  more than 50% of the time was spent in counseling and/or coordinating care on safety. Pt understands and agrees with the plan of care outlined.     Alonza Bogus, DO

## 2019-01-24 NOTE — Patient Instructions (Addendum)
HIP: External Rotation    Sit at edge of surface. Cross one leg over other knee. Press down gently on knee until you feel a gentle stretch. Hold __15-30_ seconds. _3__ reps per set, __1-2_ sets per day  Copyright  [employer]. All rights reserved.

## 2019-01-26 ENCOUNTER — Other Ambulatory Visit: Payer: Self-pay

## 2019-01-26 ENCOUNTER — Encounter: Payer: Self-pay | Admitting: Physical Therapy

## 2019-01-26 ENCOUNTER — Ambulatory Visit: Payer: Medicare Other | Admitting: Physical Therapy

## 2019-01-26 DIAGNOSIS — G8929 Other chronic pain: Secondary | ICD-10-CM

## 2019-01-26 DIAGNOSIS — R293 Abnormal posture: Secondary | ICD-10-CM

## 2019-01-26 DIAGNOSIS — M545 Low back pain: Secondary | ICD-10-CM | POA: Diagnosis not present

## 2019-01-27 NOTE — Therapy (Signed)
[facility] [address] [place], [place], [address] Phone: [phone]   Fax:  [phone]  Physical Therapy Treatment  Patient Details  Name: Gail Mercado MRN: [id number] Date of Birth: 1951/03/17 Referring Provider (PT): Tat, Wells Guiles    Encounter Date: 01/26/2019  PT End of Session - 01/27/19 0940    Visit Number  7    Number of Visits  9    Date for PT Re-Evaluation  02/11/19    Authorization Type  [facility] Medicare 10th visit progress noted required    PT Start Time  1018    PT Stop Time  1051    PT Time Calculation (min)  33 min    Activity Tolerance  Patient tolerated treatment well    Behavior During Therapy  [facility] for tasks assessed/performed   increased truncal and lower extremity dyskinesias noted during seated portion of eval      Past Medical History:  Diagnosis Date  . Anxiety   . Asthma   . Back pain   . Bronchiectasis   . Bronchitis, chronic ([place])   . COPD (chronic obstructive pulmonary disease) ([place])   . Depression   . Hypertension   . Hypothyroidism   . Neuromuscular disorder ([place])    parkinson's disease  . Parkinson disease ([place])   . Pneumonia   . Pulmonary nodule   . Thyroid disease     Past Surgical History:  Procedure Laterality Date  . back surgery in 1996 & 2005    . breast biopsy 1976    . CATARACT EXTRACTION, BILATERAL  05/31/2015, 06-21-15  . TOE SURGERY Left 01/09/2015    There were no vitals filed for this visit.  Subjective Assessment - 01/26/19 1019    Subjective  Scheduled to have the MRI on 9/23.  No pain in my back right now.    Pertinent History  asthma, hx of back surgeries    Patient Stated Goals  Pt's goal for therapy is to improve mobility and get rid of pain.    Currently in Pain?  No/denies    Pain Onset  More than a month ago    Pain Onset  In the past 7 days                       [facility] Adult PT Treatment/Exercise - 01/27/19 0937      Self-Care   Self-Care  Other Self-Care Comments    Other Self-Care Comments   Discussed local Parkinson's resources as well as online PD resources.  Discussed walking program-conitnuing to walk as she does as track at park, but use stretching prior, during, post walking to manage the pain.      Exercises   Exercises  Lumbar      Lumbar Exercises: Seated   Other Seated Lumbar Exercises  Seated anterior/posterior pelvic tilts x 5 reps, then seated trunk rotation x 5 reps each side (as means to perform seated low back flexibility exercises throughout the day, as possible alternatives to the supine exercises she has).      Other Seated Lumbar Exercises  Core stability exercises in sitting:  abdominal activation then UE lifts x 5, marching 2 sets x 5, then alternating UE/LE lifts; seated LAQ x 5 reps, then alternating UE/LAQ x 5 reps.  Cues provided for maintaining abdominal activation throughout.      Oswestry Disability Index score: 25% disability (highest scores are on walking, sitting, social life (2), sleeping (3))-improved  from 32% disability rating at eval.             PT Long Term Goals - 01/26/19 1028      PT LONG TERM GOAL #1   Title  Pt will be independent with HEP for improved flexibility, mobility, decreased pain.  TARGET 01/21/2019 (due to delayed start due to scheduling)    Time  5    Period  Weeks    Status  Achieved      PT LONG TERM GOAL #2   Title  Pt will verbalize/demo understanding of walking program for exercise, with no greater than 1-2 point increase in pain with ambulation.    Baseline  Walks track at park, 3x/wk (most weeks)-pt reports pain goes from 0 to 4/10    Time  5    Period  Weeks    Status  Not Met      PT LONG TERM GOAL #3   Title  Pt will decrease Oswestry Pain score to less than or equal to 25% disability due to back pain, for improved functional mobility.    Baseline  32%>26% disability (01/26/2019)    Time  5    Period  Weeks    Status   Partially Met      PT LONG TERM GOAL #4   Title  FGA to be assessed, with goal to be written as appropriate, to address balance.    Baseline  Initial FGA score 27/30 (no goal needed)    Time  5    Period  Weeks    Status  Deferred      PT LONG TERM GOAL #5   Title  Pt will verbalize understanding of local Parkinson's disease resources for exercise and optimal disease management.    Time  5    Period  Weeks    Status  Achieved            Plan - 01/27/19 0941    Clinical Impression Statement  Assessed pt's LTGs this visit, with pt meeting 2 of 5 LTGs.  LTG 1 met for HEP and LTG 5 met for Parkinson's resources.  Pt has partially met LTG 3 for improved Oswestry score to 26% disability (just not to goal level); LTG 2 not met.  Pt's back pain at night continues, but pt has been given and return demo understanding of varied exercises for flexibility and stretching as a means to manage pain.  Pt is appropirate for d/c at this time.    Personal Factors and Comorbidities  Comorbidity 3+    Comorbidities  anxiety, depression, hx of 2 back surgeries    Examination-Activity Limitations  Sleep;Locomotion Level    Examination-Participation Restrictions  Other   caring for 20 yo grandchildren   Stability/Clinical Decision Making  Stable/Uncomplicated    Rehab Potential  Good    PT Frequency  2x / week   2x/wk for 4 weeks   PT Duration  Other (comment)   plus eval, total POC = 5 weeks   PT Treatment/Interventions  ADLs/Self Care Home Management;Gait training;Stair training;Functional mobility training;Therapeutic activities;Therapeutic exercise;Balance training;Neuromuscular re-education;Patient/family education;Manual techniques;Moist Heat;Electrical Stimulation    PT Next Visit Plan  Discharge this visit    PT Home Exercise Plan  [id number]    Consulted and Agree with Plan of Care  Patient       Patient will benefit from skilled therapeutic intervention in order to improve the following  deficits and impairments:  Abnormal gait, Decreased mobility, Postural dysfunction  Visit Diagnosis: Abnormal posture  Chronic bilateral low back pain without sciatica     Problem List Patient Active Problem List   Diagnosis Date Noted  . Low back pain 09/07/2013  . Right lumbar radiculopathy 09/07/2013  . Hypertension   . Anxiety   . Parkinson disease ([place])   . Unspecified essential hypertension 06/30/2012  . Parkinson's disease (tremor, stiffness, slow motion, unstable posture) ([place]) 06/30/2012  . PULMONARY NODULE 04/24/2010  . ASTHMA 11/20/2009  . BRONCHIECTASIS W/O ACUTE EXACERBATION 11/20/2009  . DEPRESSION/ANXIETY 11/19/2009    Ashly Yepez W. 01/27/2019, 9:47 AM Frazier Butt., PT  [facility] [address] [place], [place], [address] Phone: [phone]   Fax:  [phone]  Name: ZAMYRA ALLENSWORTH MRN: [id number]  Date of Birth: [birth year]   PHYSICAL THERAPY DISCHARGE SUMMARY  Visits from Start of Care: 7  Current functional level related to goals / functional outcomes: PT Long Term Goals - 01/26/19 1028      PT LONG TERM GOAL #1   Title  Pt will be independent with HEP for improved flexibility, mobility, decreased pain.  TARGET 01/21/2019 (due to delayed start due to scheduling)    Time  5    Period  Weeks    Status  Achieved      PT LONG TERM GOAL #2   Title  Pt will verbalize/demo understanding of walking program for exercise, with no greater than 1-2 point increase in pain with ambulation.    Baseline  Walks track at park, 3x/wk (most weeks)-pt reports pain goes from 0 to 4/10    Time  5    Period  Weeks    Status  Not Met      PT LONG TERM GOAL #3   Title  Pt will decrease Oswestry Pain score to less than or equal to 25% disability due to back pain, for improved functional mobility.    Baseline  32%>26% disability (01/26/2019)    Time  5    Period  Weeks    Status  Partially Met      PT  LONG TERM GOAL #4   Title  FGA to be assessed, with goal to be written as appropriate, to address balance.    Baseline  Initial FGA score 27/30 (no goal needed)    Time  5    Period  Weeks    Status  Deferred      PT LONG TERM GOAL #5   Title  Pt will verbalize understanding of local Parkinson's disease resources for exercise and optimal disease management.    Time  5    Period  Weeks    Status  Achieved     Pt has met 2 of 5 LTGs.  Continues to have pain at night in low back    Remaining deficits: Ongoing back pain at night (not able to be reproduced in PT sessions); pt is following up with Dr. Vertell Limber.   Education / Equipment: Educated in [employer], local Parkinson's disease resources  Plan: Patient agrees to discharge.  Patient goals were partially met. Patient is being discharged due to being pleased with the current functional level.  ?????     Mady Haagensen, PT 01/27/19 9:48 AM Phone: [phone] Fax: [phone]

## 2019-01-28 ENCOUNTER — Encounter: Payer: Self-pay | Admitting: Neurology

## 2019-01-28 ENCOUNTER — Other Ambulatory Visit: Payer: Self-pay

## 2019-01-28 ENCOUNTER — Telehealth (INDEPENDENT_AMBULATORY_CARE_PROVIDER_SITE_OTHER): Payer: Medicare Other | Admitting: Neurology

## 2019-01-28 VITALS — Ht 63.0 in | Wt 170.0 lb

## 2019-01-28 DIAGNOSIS — G8929 Other chronic pain: Secondary | ICD-10-CM

## 2019-01-28 DIAGNOSIS — G249 Dystonia, unspecified: Secondary | ICD-10-CM | POA: Diagnosis not present

## 2019-01-28 DIAGNOSIS — M545 Low back pain, unspecified: Secondary | ICD-10-CM

## 2019-01-28 DIAGNOSIS — G20 Parkinson's disease: Secondary | ICD-10-CM | POA: Diagnosis not present

## 2019-01-28 MED ORDER — CARBIDOPA-LEVODOPA ER 50-200 MG PO TBCR: 1.0000 | EXTENDED_RELEASE_TABLET | Freq: Every day | ORAL | 1 refills | Status: DC

## 2019-02-03 ENCOUNTER — Ambulatory Visit: Payer: Medicare Other | Admitting: Neurology

## 2019-02-19 ENCOUNTER — Other Ambulatory Visit: Payer: Self-pay | Admitting: Neurology

## 2019-02-21 NOTE — Telephone Encounter (Signed)
Requested Prescriptions   Pending Prescriptions Disp Refills  . rOPINIRole (REQUIP XL) 8 MG 24 hr tablet [Pharmacy Med Name: rOPINIRole HCl ER 8 MG Oral Tablet Extended Release 24 Hour] 270 tablet 0    Sig: TAKE 1 TABLET BY MOUTH IN THE MORNING AND 2 TABLETS AT NIGHT   Rx last filled: 08/06/18 #270 1 refils  Pt last seen:01/28/19  Follow up appt scheduled: 07/07/19

## 2019-03-13 ENCOUNTER — Other Ambulatory Visit: Payer: Self-pay | Admitting: Neurology

## 2019-03-24 ENCOUNTER — Other Ambulatory Visit: Payer: Self-pay | Admitting: Neurology

## 2019-05-12 ENCOUNTER — Other Ambulatory Visit: Payer: Self-pay | Admitting: Neurology

## 2019-07-05 NOTE — Progress Notes (Signed)
Gail Mercado was seen today in follow up for Parkinsons disease.   Started the CR last visit but she d/c it after 1 week.  She felt that it was "too strong."  She states that she was having trouble distignuishing b/w Parkinsons Disease pain and back tightness pain and she got injections for pain.  It worked well.   My previous records were reviewed prior to todays visit as well as outside records available to me. Pt denies falls.  Pt denies lightheadedness, near syncope.  No hallucinations.  Mood has been good.  She has moved to [place] to be near grandchildren.    Current prescribed movement disorder medications: Carbidopa/levodopa 25/100, 1 tablet 5 times per day Requip XL, 8 mg, 1 tablet in the morning, 2 tablets at night Carbidopa/levodopa 50/200 at bedtime (started last visit) Clonazepam (only takes at as needed) Effexor XR, 150 mg daily  PREVIOUS MEDICATIONS:  entacapone; Requip; levodopa; carbidopa/levodopa 50/200 q hs (pt felt too strong and d/c after 1 week - ? SE)  ALLERGIES:   Allergies  Allergen Reactions  . Ciprofloxacin     REACTION: unknown  . Penicillins     REACTION: hives  . Wasp Venom Swelling    CURRENT MEDICATIONS:  Outpatient Encounter Medications as of 07/07/2019  Medication Sig  . albuterol (VENTOLIN HFA) 108 (90 Base) MCG/ACT inhaler INHALE 1 PUFF BY MOUTH EVERY 4 TO 6 HOURS AS NEEDED  . Calcium Citrate-Vitamin D (CALCIUM + D PO) Take 1 tablet by mouth daily.  . carbidopa-levodopa (SINEMET IR) 25-100 MG tablet Take 1 tablet by mouth 4 times daily (Patient taking differently: Take 1 tablet by mouth 5 (five) times daily. )  . DULERA 200-5 MCG/ACT AERO Take 2 puffs by mouth 2 (two) times daily.  . fluticasone (FLONASE) 50 MCG/ACT nasal spray Place 1 spray into both nostrils daily.  Marland Kitchen guaiFENesin (MUCINEX) 600 MG 12 hr tablet Take 2 tablets (1,200 mg total) by mouth 2 (two) times daily as needed for cough or to loosen phlegm.  . hydrochlorothiazide  (HYDRODIURIL) 25 MG tablet Take 25 mg by mouth daily.  Marland Kitchen levothyroxine (SYNTHROID, LEVOTHROID) 25 MCG tablet Take 25 mcg by mouth daily.  . montelukast (SINGULAIR) 10 MG tablet Take 10 mg by mouth at bedtime.  . polyethylene glycol (MIRALAX / GLYCOLAX) packet Take 17 g by mouth daily as needed.   . Probiotic Product (PROBIOTIC-10 PO) Take 1 tablet by mouth in the morning and at bedtime.   Marland Kitchen rOPINIRole (REQUIP XL) 8 MG 24 hr tablet TAKE 1 TABLET BY MOUTH IN THE MORNING AND 2 NIGHTLY  . venlafaxine XR (EFFEXOR-XR) 150 MG 24 hr capsule TAKE 1 CAPSULE BY MOUTH ONCE DAILY WITH BREAKFAST  . vitamin B-12 (CYANOCOBALAMIN) 500 MCG tablet Take 500 mcg by mouth 3 (three) times a week.   . [DISCONTINUED] aspirin-acetaminophen-caffeine (EXCEDRIN MIGRAINE) 250-250-65 MG per tablet Take 1 tablet by mouth every 6 (six) hours as needed for pain.  . [DISCONTINUED] carbidopa-levodopa (SINEMET CR) 50-200 MG tablet Take 1 tablet by mouth at bedtime. (Patient not taking: Reported on 07/07/2019)  . [DISCONTINUED] cholecalciferol (VITAMIN D) 1000 units tablet Take 1,000 Units by mouth daily.   No facility-administered encounter medications on file as of 07/07/2019.    PHYSICAL EXAMINATION:    VITALS:   Vitals:   07/07/19 1106  BP: (!) 153/90  Pulse: 83  SpO2: 93%  Weight: 166 lb (75.3 kg)  Height: 5\' 3"  (1.6 m)    GEN:  The patient appears stated age and is in NAD. HEENT:  Normocephalic, atraumatic.  The mucous membranes are moist. The superficial temporal arteries are without ropiness or tenderness. CV:  RRR Lungs:  CTAB Neck/HEME:  There are no carotid bruits bilaterally.  Neurological examination:  Orientation: The patient is alert and oriented x3. Cranial nerves: There is good facial symmetry with no significant facial hypomimia. The speech is fluent and clear. Soft palate rises symmetrically and there is no tongue deviation. Hearing is intact to conversational tone. Sensation: Sensation is intact to  light touch throughout Motor: Strength is at least antigravity x4.  Movement examination: Tone: There is nl tone in the UE/LE Abnormal movements: There is mild dyskinesia in the left lower extremity Coordination:  There is mild decremation with finger taps bilaterally. Gait and Station: The patient has no difficulty arising out of a deep-seated chair without the use of the hands. The patient's stride length is good.     ASSESSMENT/PLAN:  1.  Parkinsons Disease, diagnosed 2012  -Continue carbidopa/levodopa 25/100, 1 tablet 5 times daily  -Patient discontinued the carbidopa/levodopa 50/200 at bedtime.  -Continue Requip XL, 8 mg, 1 tablet in the morning, 2 tablets at night  -she has moved to [place].  Discussed whether to transfer care to [facility] neuro, Dr. Ulyess Blossom.  She doesn't want to do that yet.   2.  Parkinsons Disease dyskinesia  -Not that bothersome to the patient.  Have discussed amantadine and decided to hold 3.  Constipation  -Follows with Dr. Russella Dar 4.  Insomnia with occasional RBD  -Has clonazepam, but only takes it occasionally 5.  Depression  -Doing well on Effexor XR, 150 mg daily.  This was refilled today. 6.  Follow-up 6 months  Total time spent on today's visit was 30 minutes, including both face-to-face time and nonface-to-face time.  Time included that spent on review of records (prior notes available to me/labs/imaging if pertinent), discussing treatment and goals, answering patient's questions and coordinating care.  Cc:  Gail Rainier, MD

## 2019-07-07 ENCOUNTER — Other Ambulatory Visit: Payer: Self-pay

## 2019-07-07 ENCOUNTER — Encounter: Payer: Self-pay | Admitting: Neurology

## 2019-07-07 ENCOUNTER — Ambulatory Visit: Payer: Medicare PPO | Admitting: Neurology

## 2019-07-07 VITALS — BP 153/90 | HR 83 | Ht 63.0 in | Wt 166.0 lb

## 2019-07-07 DIAGNOSIS — F330 Major depressive disorder, recurrent, mild: Secondary | ICD-10-CM | POA: Diagnosis not present

## 2019-07-07 DIAGNOSIS — G20 Parkinson's disease: Secondary | ICD-10-CM | POA: Diagnosis not present

## 2019-07-07 DIAGNOSIS — G249 Dystonia, unspecified: Secondary | ICD-10-CM | POA: Diagnosis not present

## 2019-07-07 MED ORDER — ROPINIROLE HCL ER 8 MG PO TB24: ORAL_TABLET | ORAL | 1 refills | Status: AC

## 2019-07-07 MED ORDER — VENLAFAXINE HCL ER 150 MG PO CP24: ORAL_CAPSULE | ORAL | 1 refills | Status: AC

## 2019-07-07 MED ORDER — CARBIDOPA-LEVODOPA 25-100 MG PO TABS: 1.0000 | ORAL_TABLET | Freq: Every day | ORAL | 1 refills | Status: DC

## 2019-07-07 NOTE — Patient Instructions (Signed)
You can let me know if you want a referral to Dr. Ulyess Blossom at [facility].  The physicians and staff at [facility] are committed to providing excellent care. You may receive a survey requesting feedback about your experience at our office. We strive to receive "very good" responses to the survey questions. If you feel that your experience would prevent you from giving the office a "very good " response, please contact our office to try to remedy the situation. We may be reached at [phone]. Thank you for taking the time out of your busy day to complete the survey.

## 2019-09-22 DIAGNOSIS — M79671 Pain in right foot: Secondary | ICD-10-CM | POA: Diagnosis not present

## 2019-10-05 DIAGNOSIS — R911 Solitary pulmonary nodule: Secondary | ICD-10-CM | POA: Diagnosis not present

## 2019-10-05 DIAGNOSIS — I4891 Unspecified atrial fibrillation: Secondary | ICD-10-CM | POA: Diagnosis not present

## 2019-10-05 DIAGNOSIS — J45909 Unspecified asthma, uncomplicated: Secondary | ICD-10-CM | POA: Diagnosis not present

## 2019-10-05 DIAGNOSIS — J849 Interstitial pulmonary disease, unspecified: Secondary | ICD-10-CM | POA: Diagnosis not present

## 2019-10-05 DIAGNOSIS — J470 Bronchiectasis with acute lower respiratory infection: Secondary | ICD-10-CM | POA: Diagnosis not present

## 2019-10-05 DIAGNOSIS — E039 Hypothyroidism, unspecified: Secondary | ICD-10-CM | POA: Diagnosis not present

## 2019-10-05 DIAGNOSIS — I361 Nonrheumatic tricuspid (valve) insufficiency: Secondary | ICD-10-CM | POA: Diagnosis not present

## 2019-10-05 DIAGNOSIS — M79671 Pain in right foot: Secondary | ICD-10-CM | POA: Diagnosis not present

## 2019-10-05 DIAGNOSIS — R4182 Altered mental status, unspecified: Secondary | ICD-10-CM | POA: Diagnosis not present

## 2019-10-05 DIAGNOSIS — E079 Disorder of thyroid, unspecified: Secondary | ICD-10-CM | POA: Diagnosis not present

## 2019-10-05 DIAGNOSIS — R05 Cough: Secondary | ICD-10-CM | POA: Diagnosis not present

## 2019-10-05 DIAGNOSIS — J479 Bronchiectasis, uncomplicated: Secondary | ICD-10-CM | POA: Diagnosis not present

## 2019-10-05 DIAGNOSIS — R0989 Other specified symptoms and signs involving the circulatory and respiratory systems: Secondary | ICD-10-CM | POA: Diagnosis not present

## 2019-10-05 DIAGNOSIS — Z20822 Contact with and (suspected) exposure to covid-19: Secondary | ICD-10-CM | POA: Diagnosis not present

## 2019-10-05 DIAGNOSIS — J1289 Other viral pneumonia: Secondary | ICD-10-CM | POA: Diagnosis not present

## 2019-10-05 DIAGNOSIS — B9719 Other enterovirus as the cause of diseases classified elsewhere: Secondary | ICD-10-CM | POA: Diagnosis not present

## 2019-10-05 DIAGNOSIS — G20 Parkinson's disease: Secondary | ICD-10-CM | POA: Diagnosis not present

## 2019-10-05 DIAGNOSIS — I340 Nonrheumatic mitral (valve) insufficiency: Secondary | ICD-10-CM | POA: Diagnosis not present

## 2019-10-05 DIAGNOSIS — J189 Pneumonia, unspecified organism: Secondary | ICD-10-CM | POA: Diagnosis not present

## 2019-10-05 DIAGNOSIS — I10 Essential (primary) hypertension: Secondary | ICD-10-CM | POA: Diagnosis not present

## 2019-10-05 DIAGNOSIS — B370 Candidal stomatitis: Secondary | ICD-10-CM | POA: Diagnosis not present

## 2019-10-05 DIAGNOSIS — J471 Bronchiectasis with (acute) exacerbation: Secondary | ICD-10-CM | POA: Diagnosis not present

## 2019-10-05 DIAGNOSIS — I480 Paroxysmal atrial fibrillation: Secondary | ICD-10-CM | POA: Diagnosis not present

## 2019-10-05 DIAGNOSIS — R0602 Shortness of breath: Secondary | ICD-10-CM | POA: Diagnosis not present

## 2019-10-05 DIAGNOSIS — R0902 Hypoxemia: Secondary | ICD-10-CM | POA: Diagnosis not present

## 2019-10-18 DIAGNOSIS — I10 Essential (primary) hypertension: Secondary | ICD-10-CM | POA: Diagnosis not present

## 2019-10-18 DIAGNOSIS — J479 Bronchiectasis, uncomplicated: Secondary | ICD-10-CM | POA: Diagnosis not present

## 2019-10-18 DIAGNOSIS — J189 Pneumonia, unspecified organism: Secondary | ICD-10-CM | POA: Diagnosis not present

## 2019-10-18 DIAGNOSIS — G20 Parkinson's disease: Secondary | ICD-10-CM | POA: Diagnosis not present

## 2019-10-18 DIAGNOSIS — E079 Disorder of thyroid, unspecified: Secondary | ICD-10-CM | POA: Diagnosis not present

## 2019-10-19 DIAGNOSIS — J45909 Unspecified asthma, uncomplicated: Secondary | ICD-10-CM | POA: Diagnosis not present

## 2019-10-19 DIAGNOSIS — G20 Parkinson's disease: Secondary | ICD-10-CM | POA: Diagnosis not present

## 2019-10-19 DIAGNOSIS — I4891 Unspecified atrial fibrillation: Secondary | ICD-10-CM | POA: Diagnosis not present

## 2019-10-19 DIAGNOSIS — Z9181 History of falling: Secondary | ICD-10-CM | POA: Diagnosis not present

## 2019-10-19 DIAGNOSIS — I10 Essential (primary) hypertension: Secondary | ICD-10-CM | POA: Diagnosis not present

## 2019-10-19 DIAGNOSIS — E039 Hypothyroidism, unspecified: Secondary | ICD-10-CM | POA: Diagnosis not present

## 2019-10-19 DIAGNOSIS — Z7901 Long term (current) use of anticoagulants: Secondary | ICD-10-CM | POA: Diagnosis not present

## 2019-10-19 DIAGNOSIS — M545 Low back pain: Secondary | ICD-10-CM | POA: Diagnosis not present

## 2019-10-19 DIAGNOSIS — R32 Unspecified urinary incontinence: Secondary | ICD-10-CM | POA: Diagnosis not present

## 2019-10-20 DIAGNOSIS — I10 Essential (primary) hypertension: Secondary | ICD-10-CM | POA: Diagnosis not present

## 2019-10-20 DIAGNOSIS — Z9181 History of falling: Secondary | ICD-10-CM | POA: Diagnosis not present

## 2019-10-20 DIAGNOSIS — J45909 Unspecified asthma, uncomplicated: Secondary | ICD-10-CM | POA: Diagnosis not present

## 2019-10-20 DIAGNOSIS — M545 Low back pain: Secondary | ICD-10-CM | POA: Diagnosis not present

## 2019-10-20 DIAGNOSIS — E039 Hypothyroidism, unspecified: Secondary | ICD-10-CM | POA: Diagnosis not present

## 2019-10-20 DIAGNOSIS — I4891 Unspecified atrial fibrillation: Secondary | ICD-10-CM | POA: Diagnosis not present

## 2019-10-20 DIAGNOSIS — Z7901 Long term (current) use of anticoagulants: Secondary | ICD-10-CM | POA: Diagnosis not present

## 2019-10-20 DIAGNOSIS — R32 Unspecified urinary incontinence: Secondary | ICD-10-CM | POA: Diagnosis not present

## 2019-10-20 DIAGNOSIS — G20 Parkinson's disease: Secondary | ICD-10-CM | POA: Diagnosis not present

## 2019-10-21 DIAGNOSIS — G20 Parkinson's disease: Secondary | ICD-10-CM | POA: Diagnosis not present

## 2019-10-21 DIAGNOSIS — Z9181 History of falling: Secondary | ICD-10-CM | POA: Diagnosis not present

## 2019-10-21 DIAGNOSIS — J45909 Unspecified asthma, uncomplicated: Secondary | ICD-10-CM | POA: Diagnosis not present

## 2019-10-21 DIAGNOSIS — I10 Essential (primary) hypertension: Secondary | ICD-10-CM | POA: Diagnosis not present

## 2019-10-21 DIAGNOSIS — I4891 Unspecified atrial fibrillation: Secondary | ICD-10-CM | POA: Diagnosis not present

## 2019-10-21 DIAGNOSIS — Z7901 Long term (current) use of anticoagulants: Secondary | ICD-10-CM | POA: Diagnosis not present

## 2019-10-21 DIAGNOSIS — E039 Hypothyroidism, unspecified: Secondary | ICD-10-CM | POA: Diagnosis not present

## 2019-10-21 DIAGNOSIS — M545 Low back pain: Secondary | ICD-10-CM | POA: Diagnosis not present

## 2019-10-21 DIAGNOSIS — R32 Unspecified urinary incontinence: Secondary | ICD-10-CM | POA: Diagnosis not present

## 2019-10-24 DIAGNOSIS — E039 Hypothyroidism, unspecified: Secondary | ICD-10-CM | POA: Diagnosis not present

## 2019-10-24 DIAGNOSIS — I10 Essential (primary) hypertension: Secondary | ICD-10-CM | POA: Diagnosis not present

## 2019-10-24 DIAGNOSIS — J45909 Unspecified asthma, uncomplicated: Secondary | ICD-10-CM | POA: Diagnosis not present

## 2019-10-24 DIAGNOSIS — G20 Parkinson's disease: Secondary | ICD-10-CM | POA: Diagnosis not present

## 2019-10-24 DIAGNOSIS — R32 Unspecified urinary incontinence: Secondary | ICD-10-CM | POA: Diagnosis not present

## 2019-10-24 DIAGNOSIS — I4891 Unspecified atrial fibrillation: Secondary | ICD-10-CM | POA: Diagnosis not present

## 2019-10-24 DIAGNOSIS — Z7901 Long term (current) use of anticoagulants: Secondary | ICD-10-CM | POA: Diagnosis not present

## 2019-10-24 DIAGNOSIS — Z9181 History of falling: Secondary | ICD-10-CM | POA: Diagnosis not present

## 2019-10-24 DIAGNOSIS — M545 Low back pain: Secondary | ICD-10-CM | POA: Diagnosis not present

## 2019-10-25 DIAGNOSIS — Z7901 Long term (current) use of anticoagulants: Secondary | ICD-10-CM | POA: Diagnosis not present

## 2019-10-25 DIAGNOSIS — G20 Parkinson's disease: Secondary | ICD-10-CM | POA: Diagnosis not present

## 2019-10-25 DIAGNOSIS — M545 Low back pain: Secondary | ICD-10-CM | POA: Diagnosis not present

## 2019-10-25 DIAGNOSIS — R32 Unspecified urinary incontinence: Secondary | ICD-10-CM | POA: Diagnosis not present

## 2019-10-25 DIAGNOSIS — H538 Other visual disturbances: Secondary | ICD-10-CM | POA: Diagnosis not present

## 2019-10-25 DIAGNOSIS — E079 Disorder of thyroid, unspecified: Secondary | ICD-10-CM | POA: Diagnosis not present

## 2019-10-25 DIAGNOSIS — Z9181 History of falling: Secondary | ICD-10-CM | POA: Diagnosis not present

## 2019-10-25 DIAGNOSIS — J189 Pneumonia, unspecified organism: Secondary | ICD-10-CM | POA: Diagnosis not present

## 2019-10-25 DIAGNOSIS — J45909 Unspecified asthma, uncomplicated: Secondary | ICD-10-CM | POA: Diagnosis not present

## 2019-10-25 DIAGNOSIS — I4891 Unspecified atrial fibrillation: Secondary | ICD-10-CM | POA: Diagnosis not present

## 2019-10-25 DIAGNOSIS — R609 Edema, unspecified: Secondary | ICD-10-CM | POA: Diagnosis not present

## 2019-10-25 DIAGNOSIS — E039 Hypothyroidism, unspecified: Secondary | ICD-10-CM | POA: Diagnosis not present

## 2019-10-25 DIAGNOSIS — I10 Essential (primary) hypertension: Secondary | ICD-10-CM | POA: Diagnosis not present

## 2019-10-25 DIAGNOSIS — N39498 Other specified urinary incontinence: Secondary | ICD-10-CM | POA: Diagnosis not present

## 2019-10-26 DIAGNOSIS — E039 Hypothyroidism, unspecified: Secondary | ICD-10-CM | POA: Diagnosis not present

## 2019-10-26 DIAGNOSIS — G20 Parkinson's disease: Secondary | ICD-10-CM | POA: Diagnosis not present

## 2019-10-26 DIAGNOSIS — R32 Unspecified urinary incontinence: Secondary | ICD-10-CM | POA: Diagnosis not present

## 2019-10-26 DIAGNOSIS — Z7901 Long term (current) use of anticoagulants: Secondary | ICD-10-CM | POA: Diagnosis not present

## 2019-10-26 DIAGNOSIS — M545 Low back pain: Secondary | ICD-10-CM | POA: Diagnosis not present

## 2019-10-26 DIAGNOSIS — I4891 Unspecified atrial fibrillation: Secondary | ICD-10-CM | POA: Diagnosis not present

## 2019-10-26 DIAGNOSIS — I10 Essential (primary) hypertension: Secondary | ICD-10-CM | POA: Diagnosis not present

## 2019-10-26 DIAGNOSIS — J45909 Unspecified asthma, uncomplicated: Secondary | ICD-10-CM | POA: Diagnosis not present

## 2019-10-26 DIAGNOSIS — Z9181 History of falling: Secondary | ICD-10-CM | POA: Diagnosis not present

## 2019-10-27 DIAGNOSIS — Z7901 Long term (current) use of anticoagulants: Secondary | ICD-10-CM | POA: Diagnosis not present

## 2019-10-27 DIAGNOSIS — E039 Hypothyroidism, unspecified: Secondary | ICD-10-CM | POA: Diagnosis not present

## 2019-10-27 DIAGNOSIS — M545 Low back pain: Secondary | ICD-10-CM | POA: Diagnosis not present

## 2019-10-27 DIAGNOSIS — Z9181 History of falling: Secondary | ICD-10-CM | POA: Diagnosis not present

## 2019-10-27 DIAGNOSIS — R32 Unspecified urinary incontinence: Secondary | ICD-10-CM | POA: Diagnosis not present

## 2019-10-27 DIAGNOSIS — G20 Parkinson's disease: Secondary | ICD-10-CM | POA: Diagnosis not present

## 2019-10-27 DIAGNOSIS — I4891 Unspecified atrial fibrillation: Secondary | ICD-10-CM | POA: Diagnosis not present

## 2019-10-27 DIAGNOSIS — I10 Essential (primary) hypertension: Secondary | ICD-10-CM | POA: Diagnosis not present

## 2019-10-27 DIAGNOSIS — J45909 Unspecified asthma, uncomplicated: Secondary | ICD-10-CM | POA: Diagnosis not present

## 2019-10-28 DIAGNOSIS — M545 Low back pain: Secondary | ICD-10-CM | POA: Diagnosis not present

## 2019-10-28 DIAGNOSIS — Z7901 Long term (current) use of anticoagulants: Secondary | ICD-10-CM | POA: Diagnosis not present

## 2019-10-28 DIAGNOSIS — I4891 Unspecified atrial fibrillation: Secondary | ICD-10-CM | POA: Diagnosis not present

## 2019-10-28 DIAGNOSIS — G20 Parkinson's disease: Secondary | ICD-10-CM | POA: Diagnosis not present

## 2019-10-28 DIAGNOSIS — I10 Essential (primary) hypertension: Secondary | ICD-10-CM | POA: Diagnosis not present

## 2019-10-28 DIAGNOSIS — E039 Hypothyroidism, unspecified: Secondary | ICD-10-CM | POA: Diagnosis not present

## 2019-10-28 DIAGNOSIS — R32 Unspecified urinary incontinence: Secondary | ICD-10-CM | POA: Diagnosis not present

## 2019-10-28 DIAGNOSIS — Z9181 History of falling: Secondary | ICD-10-CM | POA: Diagnosis not present

## 2019-10-28 DIAGNOSIS — J45909 Unspecified asthma, uncomplicated: Secondary | ICD-10-CM | POA: Diagnosis not present

## 2019-11-01 DIAGNOSIS — M545 Low back pain: Secondary | ICD-10-CM | POA: Diagnosis not present

## 2019-11-01 DIAGNOSIS — Z9181 History of falling: Secondary | ICD-10-CM | POA: Diagnosis not present

## 2019-11-01 DIAGNOSIS — I10 Essential (primary) hypertension: Secondary | ICD-10-CM | POA: Diagnosis not present

## 2019-11-01 DIAGNOSIS — I4891 Unspecified atrial fibrillation: Secondary | ICD-10-CM | POA: Diagnosis not present

## 2019-11-01 DIAGNOSIS — G20 Parkinson's disease: Secondary | ICD-10-CM | POA: Diagnosis not present

## 2019-11-01 DIAGNOSIS — R32 Unspecified urinary incontinence: Secondary | ICD-10-CM | POA: Diagnosis not present

## 2019-11-01 DIAGNOSIS — Z7901 Long term (current) use of anticoagulants: Secondary | ICD-10-CM | POA: Diagnosis not present

## 2019-11-01 DIAGNOSIS — J45909 Unspecified asthma, uncomplicated: Secondary | ICD-10-CM | POA: Diagnosis not present

## 2019-11-01 DIAGNOSIS — E039 Hypothyroidism, unspecified: Secondary | ICD-10-CM | POA: Diagnosis not present

## 2019-11-02 DIAGNOSIS — E039 Hypothyroidism, unspecified: Secondary | ICD-10-CM | POA: Diagnosis not present

## 2019-11-02 DIAGNOSIS — G20 Parkinson's disease: Secondary | ICD-10-CM | POA: Diagnosis not present

## 2019-11-02 DIAGNOSIS — R32 Unspecified urinary incontinence: Secondary | ICD-10-CM | POA: Diagnosis not present

## 2019-11-02 DIAGNOSIS — I10 Essential (primary) hypertension: Secondary | ICD-10-CM | POA: Diagnosis not present

## 2019-11-02 DIAGNOSIS — J45909 Unspecified asthma, uncomplicated: Secondary | ICD-10-CM | POA: Diagnosis not present

## 2019-11-02 DIAGNOSIS — M545 Low back pain: Secondary | ICD-10-CM | POA: Diagnosis not present

## 2019-11-02 DIAGNOSIS — Z7901 Long term (current) use of anticoagulants: Secondary | ICD-10-CM | POA: Diagnosis not present

## 2019-11-02 DIAGNOSIS — I4891 Unspecified atrial fibrillation: Secondary | ICD-10-CM | POA: Diagnosis not present

## 2019-11-02 DIAGNOSIS — Z9181 History of falling: Secondary | ICD-10-CM | POA: Diagnosis not present

## 2019-11-03 DIAGNOSIS — J45909 Unspecified asthma, uncomplicated: Secondary | ICD-10-CM | POA: Diagnosis not present

## 2019-11-03 DIAGNOSIS — M545 Low back pain: Secondary | ICD-10-CM | POA: Diagnosis not present

## 2019-11-03 DIAGNOSIS — Z7901 Long term (current) use of anticoagulants: Secondary | ICD-10-CM | POA: Diagnosis not present

## 2019-11-03 DIAGNOSIS — E039 Hypothyroidism, unspecified: Secondary | ICD-10-CM | POA: Diagnosis not present

## 2019-11-03 DIAGNOSIS — R32 Unspecified urinary incontinence: Secondary | ICD-10-CM | POA: Diagnosis not present

## 2019-11-03 DIAGNOSIS — I4891 Unspecified atrial fibrillation: Secondary | ICD-10-CM | POA: Diagnosis not present

## 2019-11-03 DIAGNOSIS — G20 Parkinson's disease: Secondary | ICD-10-CM | POA: Diagnosis not present

## 2019-11-03 DIAGNOSIS — I10 Essential (primary) hypertension: Secondary | ICD-10-CM | POA: Diagnosis not present

## 2019-11-03 DIAGNOSIS — Z9181 History of falling: Secondary | ICD-10-CM | POA: Diagnosis not present

## 2019-11-05 DIAGNOSIS — J189 Pneumonia, unspecified organism: Secondary | ICD-10-CM | POA: Diagnosis not present

## 2019-11-05 DIAGNOSIS — I509 Heart failure, unspecified: Secondary | ICD-10-CM | POA: Diagnosis not present

## 2019-11-05 DIAGNOSIS — Z8701 Personal history of pneumonia (recurrent): Secondary | ICD-10-CM | POA: Diagnosis not present

## 2019-11-05 DIAGNOSIS — I5031 Acute diastolic (congestive) heart failure: Secondary | ICD-10-CM | POA: Diagnosis not present

## 2019-11-05 DIAGNOSIS — E873 Alkalosis: Secondary | ICD-10-CM | POA: Diagnosis not present

## 2019-11-05 DIAGNOSIS — R069 Unspecified abnormalities of breathing: Secondary | ICD-10-CM | POA: Diagnosis not present

## 2019-11-05 DIAGNOSIS — J80 Acute respiratory distress syndrome: Secondary | ICD-10-CM | POA: Diagnosis not present

## 2019-11-05 DIAGNOSIS — R0602 Shortness of breath: Secondary | ICD-10-CM | POA: Diagnosis not present

## 2019-11-05 DIAGNOSIS — Z20822 Contact with and (suspected) exposure to covid-19: Secondary | ICD-10-CM | POA: Diagnosis not present

## 2019-11-05 DIAGNOSIS — I10 Essential (primary) hypertension: Secondary | ICD-10-CM | POA: Diagnosis not present

## 2019-11-05 DIAGNOSIS — R0902 Hypoxemia: Secondary | ICD-10-CM | POA: Diagnosis not present

## 2019-11-05 DIAGNOSIS — J479 Bronchiectasis, uncomplicated: Secondary | ICD-10-CM | POA: Diagnosis not present

## 2019-11-05 DIAGNOSIS — I110 Hypertensive heart disease with heart failure: Secondary | ICD-10-CM | POA: Diagnosis not present

## 2019-11-05 DIAGNOSIS — E8770 Fluid overload, unspecified: Secondary | ICD-10-CM | POA: Diagnosis not present

## 2019-11-05 DIAGNOSIS — R05 Cough: Secondary | ICD-10-CM | POA: Diagnosis not present

## 2019-11-05 DIAGNOSIS — G20 Parkinson's disease: Secondary | ICD-10-CM | POA: Diagnosis not present

## 2019-11-05 DIAGNOSIS — J849 Interstitial pulmonary disease, unspecified: Secondary | ICD-10-CM | POA: Diagnosis not present

## 2019-11-05 DIAGNOSIS — R0600 Dyspnea, unspecified: Secondary | ICD-10-CM | POA: Diagnosis not present

## 2019-11-05 DIAGNOSIS — I4891 Unspecified atrial fibrillation: Secondary | ICD-10-CM | POA: Diagnosis not present

## 2019-11-05 DIAGNOSIS — J9621 Acute and chronic respiratory failure with hypoxia: Secondary | ICD-10-CM | POA: Diagnosis not present

## 2019-11-06 DIAGNOSIS — I5031 Acute diastolic (congestive) heart failure: Secondary | ICD-10-CM | POA: Diagnosis not present

## 2019-11-07 DIAGNOSIS — F419 Anxiety disorder, unspecified: Secondary | ICD-10-CM | POA: Diagnosis not present

## 2019-11-07 DIAGNOSIS — Z7901 Long term (current) use of anticoagulants: Secondary | ICD-10-CM | POA: Diagnosis not present

## 2019-11-07 DIAGNOSIS — R32 Unspecified urinary incontinence: Secondary | ICD-10-CM | POA: Diagnosis not present

## 2019-11-07 DIAGNOSIS — M545 Low back pain: Secondary | ICD-10-CM | POA: Diagnosis not present

## 2019-11-07 DIAGNOSIS — F5101 Primary insomnia: Secondary | ICD-10-CM | POA: Diagnosis not present

## 2019-11-07 DIAGNOSIS — E039 Hypothyroidism, unspecified: Secondary | ICD-10-CM | POA: Diagnosis not present

## 2019-11-07 DIAGNOSIS — I4891 Unspecified atrial fibrillation: Secondary | ICD-10-CM | POA: Diagnosis not present

## 2019-11-07 DIAGNOSIS — J45909 Unspecified asthma, uncomplicated: Secondary | ICD-10-CM | POA: Diagnosis not present

## 2019-11-07 DIAGNOSIS — G20 Parkinson's disease: Secondary | ICD-10-CM | POA: Diagnosis not present

## 2019-11-07 DIAGNOSIS — I10 Essential (primary) hypertension: Secondary | ICD-10-CM | POA: Diagnosis not present

## 2019-11-07 DIAGNOSIS — J479 Bronchiectasis, uncomplicated: Secondary | ICD-10-CM | POA: Diagnosis not present

## 2019-11-07 DIAGNOSIS — Z9181 History of falling: Secondary | ICD-10-CM | POA: Diagnosis not present

## 2019-11-08 DIAGNOSIS — Z7901 Long term (current) use of anticoagulants: Secondary | ICD-10-CM | POA: Diagnosis not present

## 2019-11-08 DIAGNOSIS — R32 Unspecified urinary incontinence: Secondary | ICD-10-CM | POA: Diagnosis not present

## 2019-11-08 DIAGNOSIS — J45909 Unspecified asthma, uncomplicated: Secondary | ICD-10-CM | POA: Diagnosis not present

## 2019-11-08 DIAGNOSIS — M545 Low back pain: Secondary | ICD-10-CM | POA: Diagnosis not present

## 2019-11-08 DIAGNOSIS — I4891 Unspecified atrial fibrillation: Secondary | ICD-10-CM | POA: Diagnosis not present

## 2019-11-08 DIAGNOSIS — G20 Parkinson's disease: Secondary | ICD-10-CM | POA: Diagnosis not present

## 2019-11-08 DIAGNOSIS — E039 Hypothyroidism, unspecified: Secondary | ICD-10-CM | POA: Diagnosis not present

## 2019-11-08 DIAGNOSIS — I10 Essential (primary) hypertension: Secondary | ICD-10-CM | POA: Diagnosis not present

## 2019-11-08 DIAGNOSIS — Z9181 History of falling: Secondary | ICD-10-CM | POA: Diagnosis not present

## 2019-11-10 DIAGNOSIS — J479 Bronchiectasis, uncomplicated: Secondary | ICD-10-CM | POA: Diagnosis not present

## 2019-11-14 DIAGNOSIS — M545 Low back pain: Secondary | ICD-10-CM | POA: Diagnosis not present

## 2019-11-14 DIAGNOSIS — I10 Essential (primary) hypertension: Secondary | ICD-10-CM | POA: Diagnosis not present

## 2019-11-14 DIAGNOSIS — R32 Unspecified urinary incontinence: Secondary | ICD-10-CM | POA: Diagnosis not present

## 2019-11-14 DIAGNOSIS — Z9181 History of falling: Secondary | ICD-10-CM | POA: Diagnosis not present

## 2019-11-14 DIAGNOSIS — G20 Parkinson's disease: Secondary | ICD-10-CM | POA: Diagnosis not present

## 2019-11-14 DIAGNOSIS — J45909 Unspecified asthma, uncomplicated: Secondary | ICD-10-CM | POA: Diagnosis not present

## 2019-11-14 DIAGNOSIS — E039 Hypothyroidism, unspecified: Secondary | ICD-10-CM | POA: Diagnosis not present

## 2019-11-14 DIAGNOSIS — Z7901 Long term (current) use of anticoagulants: Secondary | ICD-10-CM | POA: Diagnosis not present

## 2019-11-14 DIAGNOSIS — I4891 Unspecified atrial fibrillation: Secondary | ICD-10-CM | POA: Diagnosis not present

## 2019-11-15 DIAGNOSIS — Z9181 History of falling: Secondary | ICD-10-CM | POA: Diagnosis not present

## 2019-11-15 DIAGNOSIS — Z7901 Long term (current) use of anticoagulants: Secondary | ICD-10-CM | POA: Diagnosis not present

## 2019-11-15 DIAGNOSIS — E039 Hypothyroidism, unspecified: Secondary | ICD-10-CM | POA: Diagnosis not present

## 2019-11-15 DIAGNOSIS — M545 Low back pain: Secondary | ICD-10-CM | POA: Diagnosis not present

## 2019-11-15 DIAGNOSIS — I4891 Unspecified atrial fibrillation: Secondary | ICD-10-CM | POA: Diagnosis not present

## 2019-11-15 DIAGNOSIS — J189 Pneumonia, unspecified organism: Secondary | ICD-10-CM | POA: Diagnosis not present

## 2019-11-15 DIAGNOSIS — R32 Unspecified urinary incontinence: Secondary | ICD-10-CM | POA: Diagnosis not present

## 2019-11-15 DIAGNOSIS — I10 Essential (primary) hypertension: Secondary | ICD-10-CM | POA: Diagnosis not present

## 2019-11-15 DIAGNOSIS — J45909 Unspecified asthma, uncomplicated: Secondary | ICD-10-CM | POA: Diagnosis not present

## 2019-11-15 DIAGNOSIS — G20 Parkinson's disease: Secondary | ICD-10-CM | POA: Diagnosis not present

## 2019-11-17 DIAGNOSIS — I4891 Unspecified atrial fibrillation: Secondary | ICD-10-CM | POA: Diagnosis not present

## 2019-11-30 DIAGNOSIS — Z1231 Encounter for screening mammogram for malignant neoplasm of breast: Secondary | ICD-10-CM | POA: Diagnosis not present

## 2019-12-08 DIAGNOSIS — S92334A Nondisplaced fracture of third metatarsal bone, right foot, initial encounter for closed fracture: Secondary | ICD-10-CM | POA: Diagnosis not present

## 2019-12-08 DIAGNOSIS — I509 Heart failure, unspecified: Secondary | ICD-10-CM | POA: Diagnosis not present

## 2019-12-08 DIAGNOSIS — M79671 Pain in right foot: Secondary | ICD-10-CM | POA: Diagnosis not present

## 2019-12-08 DIAGNOSIS — S92344A Nondisplaced fracture of fourth metatarsal bone, right foot, initial encounter for closed fracture: Secondary | ICD-10-CM | POA: Diagnosis not present

## 2019-12-08 DIAGNOSIS — I4891 Unspecified atrial fibrillation: Secondary | ICD-10-CM | POA: Diagnosis not present

## 2019-12-16 DIAGNOSIS — J189 Pneumonia, unspecified organism: Secondary | ICD-10-CM | POA: Diagnosis not present

## 2019-12-16 DIAGNOSIS — I4891 Unspecified atrial fibrillation: Secondary | ICD-10-CM | POA: Diagnosis not present

## 2019-12-27 DIAGNOSIS — I5032 Chronic diastolic (congestive) heart failure: Secondary | ICD-10-CM | POA: Diagnosis not present

## 2019-12-27 DIAGNOSIS — I480 Paroxysmal atrial fibrillation: Secondary | ICD-10-CM | POA: Diagnosis not present

## 2019-12-27 DIAGNOSIS — I10 Essential (primary) hypertension: Secondary | ICD-10-CM | POA: Diagnosis not present

## 2019-12-30 DIAGNOSIS — S92334A Nondisplaced fracture of third metatarsal bone, right foot, initial encounter for closed fracture: Secondary | ICD-10-CM | POA: Diagnosis not present

## 2019-12-30 DIAGNOSIS — M25562 Pain in left knee: Secondary | ICD-10-CM | POA: Diagnosis not present

## 2019-12-30 DIAGNOSIS — M25561 Pain in right knee: Secondary | ICD-10-CM | POA: Diagnosis not present

## 2019-12-30 DIAGNOSIS — M170 Bilateral primary osteoarthritis of knee: Secondary | ICD-10-CM | POA: Diagnosis not present

## 2020-01-10 ENCOUNTER — Other Ambulatory Visit: Payer: Self-pay

## 2020-01-10 MED ORDER — CARBIDOPA-LEVODOPA 25-100 MG PO TABS: 1.0000 | ORAL_TABLET | Freq: Every day | ORAL | 0 refills | Status: DC

## 2020-01-10 NOTE — Telephone Encounter (Signed)
Received refill request (carbidopa levodopa) for patient. No follow up appt scheduled with Dr Tat. However the patient has a follow up appt with Dr Karel Jarvis on 01/25/2020.

## 2020-01-11 DIAGNOSIS — M545 Low back pain: Secondary | ICD-10-CM | POA: Diagnosis not present

## 2020-01-16 DIAGNOSIS — I4891 Unspecified atrial fibrillation: Secondary | ICD-10-CM | POA: Diagnosis not present

## 2020-01-16 DIAGNOSIS — J189 Pneumonia, unspecified organism: Secondary | ICD-10-CM | POA: Diagnosis not present

## 2020-01-17 DIAGNOSIS — M545 Low back pain: Secondary | ICD-10-CM | POA: Diagnosis not present

## 2020-01-19 DIAGNOSIS — M545 Low back pain: Secondary | ICD-10-CM | POA: Diagnosis not present

## 2020-01-23 NOTE — Progress Notes (Signed)
Assessment/Plan:   1.  Parkinsons Disease  -pt moved carbidopa/levodopa 50/200 from q hs to AM and she thinks that this has helped.    -Continue carbidopa/levodopa 25/100, 1 tablet 5 times per day  -Continue Requip XL, 8 mg, 1 in the morning and 2 at night  -living in [place] now.  Discussed getting neuro closer to home and we will send referral to Dr. Darlina Rumpf. 2.  PD dyskinesia  -Not bothersome to patient and no medication required right now. 3.  Slow transit constipation  -Follows with Dr. Russella Dar 4.  Depression  -Doing well on Effexor XR, 150 mg daily 5.  Insomnia, occasional RBD  -Only uses clonazepam as needed 6.  PAF with near syncope x 2  -following with cardiology now   -suspect that near syncope related to PAF and possibly lowering of BP.  She has upcoming appt with cardiology on 10/5 7.  SOB/DOE  -f/u with pulmonary  Subjective:   Gail Mercado was seen today in follow up for Parkinsons disease.  My previous records were reviewed prior to todays visit as well as outside records available to me.  Was hospitalized in May.  Has been diagnosed with paroxysmal atrial fibrillation after being admitted for SOB and it was determined that she was in atrial fib with RVR.   Pt had fall on 4/9 - tripped over a cord and fx some bones in her R toes.  Pt is going to PT and massage therapy.  She has gotten a Psychologist, educational at [employer] in [place].  She came here yesterday for this appt - was staying with a friend overnight.  She felt near syncopal and she went to her knees.  No LOC.  This happened again this morning.    Pt has changed her Parkinsons Disease meds somewhat since last visit.  She added carbidopa/levodopa 50/200 in the AM and then starts carbidopa/levodopa 25/100 at noon and will take 4 others (total of 5 in the day)  Current prescribed movement disorder medications: carbidopa/levodopa 25/100, 1 tablet 5 times per day Requip XL, 8 mg, 1 in the morning and 2 at night Clonazepam (pt  reports no longer taking) Effexor XR, 150 mg daily   PREVIOUS MEDICATIONS:  entacapone; Requip; levodopa; carbidopa/levodopa 50/200 q hs (pt felt too strong and d/c after 1 week - ? SE)  ALLERGIES:   Allergies  Allergen Reactions  . Ciprofloxacin     REACTION: unknown  . Penicillins     REACTION: hives  . Wasp Venom Swelling    CURRENT MEDICATIONS:  Outpatient Encounter Medications as of 01/25/2020  Medication Sig  . albuterol (VENTOLIN HFA) 108 (90 Base) MCG/ACT inhaler INHALE 1 PUFF BY MOUTH EVERY 4 TO 6 HOURS AS NEEDED  . Calcium Citrate-Vitamin D (CALCIUM + D PO) Take 1 tablet by mouth daily.  . carbidopa-levodopa (SINEMET CR) 50-200 MG tablet Take 1 tablet by mouth daily.  . carbidopa-levodopa (SINEMET IR) 25-100 MG tablet Take 1 tablet by mouth 5 (five) times daily. Take 1 tablet by mouth 4 times daily (Patient taking differently: Take 1 tablet by mouth 4 (four) times daily. )  . fluticasone (FLONASE) 50 MCG/ACT nasal spray Place 1 spray into both nostrils daily.  Marland Kitchen guaiFENesin (MUCINEX) 600 MG 12 hr tablet Take 2 tablets (1,200 mg total) by mouth 2 (two) times daily as needed for cough or to loosen phlegm.  . hydrochlorothiazide (HYDRODIURIL) 25 MG tablet Take 25 mg by mouth daily.  Marland Kitchen levothyroxine (SYNTHROID,  LEVOTHROID) 25 MCG tablet Take 25 mcg by mouth daily.  . montelukast (SINGULAIR) 10 MG tablet Take 10 mg by mouth at bedtime.  . polyethylene glycol (MIRALAX / GLYCOLAX) packet Take 17 g by mouth daily as needed.   . Probiotic Product (PROBIOTIC-10 PO) Take 1 tablet by mouth in the morning and at bedtime.   Marland Kitchen rOPINIRole (REQUIP XL) 8 MG 24 hr tablet TAKE 1 TABLET BY MOUTH IN THE MORNING AND 2 NIGHTLY  . SYMBICORT 160-4.5 MCG/ACT inhaler Inhale 2 puffs into the lungs in the morning and at bedtime.  Marland Kitchen venlafaxine XR (EFFEXOR-XR) 150 MG 24 hr capsule TAKE 1 CAPSULE BY MOUTH ONCE DAILY WITH BREAKFAST  . vitamin B-12 (CYANOCOBALAMIN) 500 MCG tablet Take 500 mcg by mouth 2  (two) times a week.   . [DISCONTINUED] DULERA 200-5 MCG/ACT AERO Take 2 puffs by mouth 2 (two) times daily. (Patient not taking: Reported on 01/25/2020)   No facility-administered encounter medications on file as of 01/25/2020.    Objective:   PHYSICAL EXAMINATION:    VITALS:  There were no vitals filed for this visit.  GEN:  The patient appears stated age and is in NAD. HEENT:  Normocephalic, atraumatic.  The mucous membranes are moist. The superficial temporal arteries are without ropiness or tenderness. CV:  RRR Lungs:  CTAB.  She has some DOE. Neck/HEME:  There are no carotid bruits bilaterally.  Neurological examination:  Orientation: The patient is alert and oriented x3. Cranial nerves: There is good facial symmetry with min facial hypomimia. The speech is fluent and clear. Soft palate rises symmetrically and there is no tongue deviation. Hearing is intact to conversational tone. Sensation: Sensation is intact to light touch throughout Motor: Strength is at least antigravity x4.  Movement examination: Tone: There is normal tone in the UE/LE Abnormal movements: pt with mild dyskinesia, L>R Coordination:  There is no decremation with RAM's, with any form of RAMS, including alternating supination and pronation of the forearm, hand opening and closing, finger taps, heel taps and toe taps. Gait and Station: The patient has no difficulty arising out of a deep-seated chair without the use of the hands. The patient's stride length is good.  She is slightly unsteady in the turn.  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  Time included that spent on review of records (prior notes available to me/labs/imaging if pertinent), discussing treatment and goals, answering patient's questions and coordinating care.  Cc:  [name], MD

## 2020-01-24 DIAGNOSIS — M545 Low back pain: Secondary | ICD-10-CM | POA: Diagnosis not present

## 2020-01-25 ENCOUNTER — Ambulatory Visit: Payer: Medicare PPO | Admitting: Neurology

## 2020-01-25 ENCOUNTER — Other Ambulatory Visit: Payer: Self-pay

## 2020-01-25 ENCOUNTER — Ambulatory Visit (INDEPENDENT_AMBULATORY_CARE_PROVIDER_SITE_OTHER): Payer: Medicare PPO | Admitting: Neurology

## 2020-01-25 ENCOUNTER — Encounter: Payer: Self-pay | Admitting: Neurology

## 2020-01-25 VITALS — BP 125/65 | HR 71 | Ht 63.0 in | Wt 165.0 lb

## 2020-01-25 DIAGNOSIS — G20 Parkinson's disease: Secondary | ICD-10-CM | POA: Diagnosis not present

## 2020-01-25 DIAGNOSIS — G249 Dystonia, unspecified: Secondary | ICD-10-CM | POA: Diagnosis not present

## 2020-01-25 NOTE — Addendum Note (Signed)
Addended by: Kandice Robinsons T on: 01/25/2020 09:12 AM   Modules accepted: Orders

## 2020-01-25 NOTE — Patient Instructions (Addendum)
1.  Congratulations on the exercise with the trainer and Physical therapist 2.  We will send a referral to [facility], Dr. Elige Radon Robottum 3.  Call your cardiologist about the near passing out spell.    The physicians and staff at [facility] are committed to providing excellent care. You may receive a survey requesting feedback about your experience at our office. We strive to receive "very good" responses to the survey questions. If you feel that your experience would prevent you from giving the office a "very good " response, please contact our office to try to remedy the situation. We may be reached at [phone]. Thank you for taking the time out of your busy day to complete the survey.

## 2020-01-26 ENCOUNTER — Telehealth: Payer: Self-pay

## 2020-01-26 DIAGNOSIS — M545 Low back pain: Secondary | ICD-10-CM | POA: Diagnosis not present

## 2020-01-26 NOTE — Telephone Encounter (Signed)
Left message for patient with Dr Don Perking response to her question per DPR, advised patient to contact the office with any questions or concerns.

## 2020-01-26 NOTE — Telephone Encounter (Signed)
When check ing patient out she aske dme if I could ask Dr Tat send her in a rx for Lunesta. Because her parkinson's disease is the reason she can not sleep.

## 2020-01-26 NOTE — Telephone Encounter (Signed)
Patient returned call and left a message to call her back.

## 2020-01-26 NOTE — Telephone Encounter (Signed)
I don't RX lunesta but I also don't treat her for that.  She will need to contact pcp

## 2020-01-27 DIAGNOSIS — J9622 Acute and chronic respiratory failure with hypercapnia: Secondary | ICD-10-CM | POA: Diagnosis not present

## 2020-01-27 DIAGNOSIS — R0602 Shortness of breath: Secondary | ICD-10-CM | POA: Diagnosis not present

## 2020-01-27 DIAGNOSIS — R55 Syncope and collapse: Secondary | ICD-10-CM | POA: Diagnosis not present

## 2020-01-27 DIAGNOSIS — J440 Chronic obstructive pulmonary disease with acute lower respiratory infection: Secondary | ICD-10-CM | POA: Diagnosis not present

## 2020-01-27 DIAGNOSIS — F419 Anxiety disorder, unspecified: Secondary | ICD-10-CM | POA: Diagnosis not present

## 2020-01-27 DIAGNOSIS — I480 Paroxysmal atrial fibrillation: Secondary | ICD-10-CM | POA: Diagnosis not present

## 2020-01-27 DIAGNOSIS — I110 Hypertensive heart disease with heart failure: Secondary | ICD-10-CM | POA: Diagnosis not present

## 2020-01-27 DIAGNOSIS — E039 Hypothyroidism, unspecified: Secondary | ICD-10-CM | POA: Diagnosis not present

## 2020-01-27 DIAGNOSIS — J209 Acute bronchitis, unspecified: Secondary | ICD-10-CM | POA: Diagnosis not present

## 2020-01-27 DIAGNOSIS — I5032 Chronic diastolic (congestive) heart failure: Secondary | ICD-10-CM | POA: Diagnosis not present

## 2020-01-27 DIAGNOSIS — J9621 Acute and chronic respiratory failure with hypoxia: Secondary | ICD-10-CM | POA: Diagnosis not present

## 2020-01-27 DIAGNOSIS — Z20822 Contact with and (suspected) exposure to covid-19: Secondary | ICD-10-CM | POA: Diagnosis not present

## 2020-01-27 DIAGNOSIS — I340 Nonrheumatic mitral (valve) insufficiency: Secondary | ICD-10-CM | POA: Diagnosis not present

## 2020-01-27 DIAGNOSIS — G20 Parkinson's disease: Secondary | ICD-10-CM | POA: Diagnosis not present

## 2020-01-27 DIAGNOSIS — J441 Chronic obstructive pulmonary disease with (acute) exacerbation: Secondary | ICD-10-CM | POA: Diagnosis not present

## 2020-01-27 DIAGNOSIS — J9602 Acute respiratory failure with hypercapnia: Secondary | ICD-10-CM | POA: Diagnosis not present

## 2020-01-27 DIAGNOSIS — I361 Nonrheumatic tricuspid (valve) insufficiency: Secondary | ICD-10-CM | POA: Diagnosis not present

## 2020-01-27 DIAGNOSIS — F329 Major depressive disorder, single episode, unspecified: Secondary | ICD-10-CM | POA: Diagnosis not present

## 2020-01-28 DIAGNOSIS — R55 Syncope and collapse: Secondary | ICD-10-CM | POA: Diagnosis not present

## 2020-01-28 DIAGNOSIS — J9621 Acute and chronic respiratory failure with hypoxia: Secondary | ICD-10-CM | POA: Diagnosis not present

## 2020-01-28 DIAGNOSIS — J9622 Acute and chronic respiratory failure with hypercapnia: Secondary | ICD-10-CM | POA: Diagnosis not present

## 2020-01-28 DIAGNOSIS — J440 Chronic obstructive pulmonary disease with acute lower respiratory infection: Secondary | ICD-10-CM | POA: Diagnosis not present

## 2020-01-28 DIAGNOSIS — I10 Essential (primary) hypertension: Secondary | ICD-10-CM | POA: Diagnosis not present

## 2020-01-28 DIAGNOSIS — E039 Hypothyroidism, unspecified: Secondary | ICD-10-CM | POA: Diagnosis not present

## 2020-01-28 DIAGNOSIS — J441 Chronic obstructive pulmonary disease with (acute) exacerbation: Secondary | ICD-10-CM | POA: Diagnosis not present

## 2020-01-28 DIAGNOSIS — G20 Parkinson's disease: Secondary | ICD-10-CM | POA: Diagnosis not present

## 2020-01-28 DIAGNOSIS — I480 Paroxysmal atrial fibrillation: Secondary | ICD-10-CM | POA: Diagnosis not present

## 2020-01-28 DIAGNOSIS — F419 Anxiety disorder, unspecified: Secondary | ICD-10-CM | POA: Diagnosis not present

## 2020-01-28 DIAGNOSIS — I5032 Chronic diastolic (congestive) heart failure: Secondary | ICD-10-CM | POA: Diagnosis not present

## 2020-01-28 DIAGNOSIS — J209 Acute bronchitis, unspecified: Secondary | ICD-10-CM | POA: Diagnosis not present

## 2020-01-28 DIAGNOSIS — F329 Major depressive disorder, single episode, unspecified: Secondary | ICD-10-CM | POA: Diagnosis not present

## 2020-01-31 DIAGNOSIS — M545 Low back pain: Secondary | ICD-10-CM | POA: Diagnosis not present

## 2020-02-02 DIAGNOSIS — M545 Low back pain: Secondary | ICD-10-CM | POA: Diagnosis not present

## 2020-02-07 DIAGNOSIS — Z136 Encounter for screening for cardiovascular disorders: Secondary | ICD-10-CM | POA: Diagnosis not present

## 2020-02-07 DIAGNOSIS — F5101 Primary insomnia: Secondary | ICD-10-CM | POA: Diagnosis not present

## 2020-02-07 DIAGNOSIS — Z0000 Encounter for general adult medical examination without abnormal findings: Secondary | ICD-10-CM | POA: Diagnosis not present

## 2020-02-07 DIAGNOSIS — I10 Essential (primary) hypertension: Secondary | ICD-10-CM | POA: Diagnosis not present

## 2020-02-07 DIAGNOSIS — E039 Hypothyroidism, unspecified: Secondary | ICD-10-CM | POA: Diagnosis not present

## 2020-02-07 DIAGNOSIS — Z23 Encounter for immunization: Secondary | ICD-10-CM | POA: Diagnosis not present

## 2020-02-07 DIAGNOSIS — I480 Paroxysmal atrial fibrillation: Secondary | ICD-10-CM | POA: Diagnosis not present

## 2020-02-15 ENCOUNTER — Telehealth: Payer: Self-pay

## 2020-02-15 DIAGNOSIS — J189 Pneumonia, unspecified organism: Secondary | ICD-10-CM | POA: Diagnosis not present

## 2020-02-15 DIAGNOSIS — I4891 Unspecified atrial fibrillation: Secondary | ICD-10-CM | POA: Diagnosis not present

## 2020-02-15 NOTE — Telephone Encounter (Signed)
Referral  Type of referral: Neurology  Provider Office/ Name: [facility]  Phone: [phone]  Fax: [phone]  Address: n/a   Appointment Date and Time: 03/22/2020 at 9:15am

## 2020-02-15 NOTE — Telephone Encounter (Signed)
See last note.  As far as I know, we already sent referral to [facility] although I had asked for Dr. Ulyess Blossom because I knew he was movement disorder.  If you already sent referral, she can call them directly

## 2020-02-22 DIAGNOSIS — M545 Low back pain, unspecified: Secondary | ICD-10-CM | POA: Diagnosis not present

## 2020-02-23 DIAGNOSIS — M79671 Pain in right foot: Secondary | ICD-10-CM | POA: Diagnosis not present

## 2020-02-23 DIAGNOSIS — S92351A Displaced fracture of fifth metatarsal bone, right foot, initial encounter for closed fracture: Secondary | ICD-10-CM | POA: Diagnosis not present

## 2020-02-23 DIAGNOSIS — S92331A Displaced fracture of third metatarsal bone, right foot, initial encounter for closed fracture: Secondary | ICD-10-CM | POA: Diagnosis not present

## 2020-02-23 DIAGNOSIS — R202 Paresthesia of skin: Secondary | ICD-10-CM | POA: Diagnosis not present

## 2020-02-23 DIAGNOSIS — R55 Syncope and collapse: Secondary | ICD-10-CM | POA: Diagnosis not present

## 2020-02-23 DIAGNOSIS — I480 Paroxysmal atrial fibrillation: Secondary | ICD-10-CM | POA: Diagnosis not present

## 2020-02-23 DIAGNOSIS — S92341A Displaced fracture of fourth metatarsal bone, right foot, initial encounter for closed fracture: Secondary | ICD-10-CM | POA: Diagnosis not present

## 2020-02-23 DIAGNOSIS — R7303 Prediabetes: Secondary | ICD-10-CM | POA: Diagnosis not present

## 2020-02-25 DIAGNOSIS — S92301A Fracture of unspecified metatarsal bone(s), right foot, initial encounter for closed fracture: Secondary | ICD-10-CM | POA: Diagnosis not present

## 2020-02-25 DIAGNOSIS — S92901K Unspecified fracture of right foot, subsequent encounter for fracture with nonunion: Secondary | ICD-10-CM | POA: Diagnosis not present

## 2020-03-02 DIAGNOSIS — I10 Essential (primary) hypertension: Secondary | ICD-10-CM | POA: Diagnosis not present

## 2020-03-02 DIAGNOSIS — I5032 Chronic diastolic (congestive) heart failure: Secondary | ICD-10-CM | POA: Diagnosis not present

## 2020-03-02 DIAGNOSIS — I480 Paroxysmal atrial fibrillation: Secondary | ICD-10-CM | POA: Diagnosis not present

## 2020-03-13 DIAGNOSIS — I10 Essential (primary) hypertension: Secondary | ICD-10-CM | POA: Diagnosis not present

## 2020-03-13 DIAGNOSIS — R55 Syncope and collapse: Secondary | ICD-10-CM | POA: Diagnosis not present

## 2020-03-13 DIAGNOSIS — I5032 Chronic diastolic (congestive) heart failure: Secondary | ICD-10-CM | POA: Diagnosis not present

## 2020-03-13 DIAGNOSIS — I480 Paroxysmal atrial fibrillation: Secondary | ICD-10-CM | POA: Diagnosis not present

## 2020-03-17 DIAGNOSIS — I4891 Unspecified atrial fibrillation: Secondary | ICD-10-CM | POA: Diagnosis not present

## 2020-03-17 DIAGNOSIS — J189 Pneumonia, unspecified organism: Secondary | ICD-10-CM | POA: Diagnosis not present

## 2020-03-20 DIAGNOSIS — J479 Bronchiectasis, uncomplicated: Secondary | ICD-10-CM | POA: Diagnosis not present

## 2020-03-22 DIAGNOSIS — Z20822 Contact with and (suspected) exposure to covid-19: Secondary | ICD-10-CM | POA: Diagnosis not present

## 2020-03-22 DIAGNOSIS — I5033 Acute on chronic diastolic (congestive) heart failure: Secondary | ICD-10-CM | POA: Diagnosis not present

## 2020-03-22 DIAGNOSIS — G20 Parkinson's disease: Secondary | ICD-10-CM | POA: Diagnosis not present

## 2020-03-22 DIAGNOSIS — J9611 Chronic respiratory failure with hypoxia: Secondary | ICD-10-CM | POA: Diagnosis not present

## 2020-03-22 DIAGNOSIS — I110 Hypertensive heart disease with heart failure: Secondary | ICD-10-CM | POA: Diagnosis not present

## 2020-03-22 DIAGNOSIS — I480 Paroxysmal atrial fibrillation: Secondary | ICD-10-CM | POA: Diagnosis not present

## 2020-03-22 DIAGNOSIS — E039 Hypothyroidism, unspecified: Secondary | ICD-10-CM | POA: Diagnosis not present

## 2020-03-22 DIAGNOSIS — J479 Bronchiectasis, uncomplicated: Secondary | ICD-10-CM | POA: Diagnosis not present

## 2020-03-22 DIAGNOSIS — R55 Syncope and collapse: Secondary | ICD-10-CM | POA: Diagnosis not present

## 2020-03-22 DIAGNOSIS — I5031 Acute diastolic (congestive) heart failure: Secondary | ICD-10-CM | POA: Diagnosis not present

## 2020-03-22 DIAGNOSIS — R0989 Other specified symptoms and signs involving the circulatory and respiratory systems: Secondary | ICD-10-CM | POA: Diagnosis not present

## 2020-03-23 DIAGNOSIS — G20 Parkinson's disease: Secondary | ICD-10-CM | POA: Diagnosis not present

## 2020-03-23 DIAGNOSIS — E039 Hypothyroidism, unspecified: Secondary | ICD-10-CM | POA: Diagnosis not present

## 2020-03-23 DIAGNOSIS — I480 Paroxysmal atrial fibrillation: Secondary | ICD-10-CM | POA: Diagnosis not present

## 2020-03-23 DIAGNOSIS — R55 Syncope and collapse: Secondary | ICD-10-CM | POA: Diagnosis not present

## 2020-03-23 DIAGNOSIS — I10 Essential (primary) hypertension: Secondary | ICD-10-CM | POA: Diagnosis not present

## 2020-03-23 DIAGNOSIS — J9611 Chronic respiratory failure with hypoxia: Secondary | ICD-10-CM | POA: Diagnosis not present

## 2020-03-23 DIAGNOSIS — Z7901 Long term (current) use of anticoagulants: Secondary | ICD-10-CM | POA: Diagnosis not present

## 2020-03-23 DIAGNOSIS — I6523 Occlusion and stenosis of bilateral carotid arteries: Secondary | ICD-10-CM | POA: Diagnosis not present

## 2020-03-23 DIAGNOSIS — J479 Bronchiectasis, uncomplicated: Secondary | ICD-10-CM | POA: Diagnosis not present

## 2020-03-23 DIAGNOSIS — J45909 Unspecified asthma, uncomplicated: Secondary | ICD-10-CM | POA: Diagnosis not present

## 2020-03-26 DIAGNOSIS — S92354D Nondisplaced fracture of fifth metatarsal bone, right foot, subsequent encounter for fracture with routine healing: Secondary | ICD-10-CM | POA: Diagnosis not present

## 2020-03-26 DIAGNOSIS — S92344D Nondisplaced fracture of fourth metatarsal bone, right foot, subsequent encounter for fracture with routine healing: Secondary | ICD-10-CM | POA: Diagnosis not present

## 2020-03-26 DIAGNOSIS — S92334D Nondisplaced fracture of third metatarsal bone, right foot, subsequent encounter for fracture with routine healing: Secondary | ICD-10-CM | POA: Diagnosis not present

## 2020-03-29 DIAGNOSIS — I5032 Chronic diastolic (congestive) heart failure: Secondary | ICD-10-CM | POA: Diagnosis not present

## 2020-03-29 DIAGNOSIS — I480 Paroxysmal atrial fibrillation: Secondary | ICD-10-CM | POA: Diagnosis not present

## 2020-03-29 DIAGNOSIS — I10 Essential (primary) hypertension: Secondary | ICD-10-CM | POA: Diagnosis not present

## 2020-03-29 DIAGNOSIS — R55 Syncope and collapse: Secondary | ICD-10-CM | POA: Diagnosis not present

## 2020-04-04 DIAGNOSIS — M1712 Unilateral primary osteoarthritis, left knee: Secondary | ICD-10-CM | POA: Diagnosis not present

## 2020-04-07 ENCOUNTER — Other Ambulatory Visit: Payer: Self-pay | Admitting: Neurology

## 2020-04-10 NOTE — Telephone Encounter (Signed)
Rx(s) sent to pharmacy electronically.  

## 2020-04-11 DEATH — deceased

## 2022-05-09 ENCOUNTER — Telehealth: Payer: Self-pay | Admitting: *Deleted
